# Patient Record
Sex: Male | Born: 1970 | Race: White | Hispanic: No | Marital: Married | Smoking: Never smoker
Health system: Southern US, Community
[De-identification: ages and names within clinical notes are randomized; demographics above are authoritative.]

## PROBLEM LIST (undated history)

## (undated) DIAGNOSIS — I10 Essential (primary) hypertension: Secondary | ICD-10-CM

## (undated) DIAGNOSIS — M1990 Unspecified osteoarthritis, unspecified site: Secondary | ICD-10-CM

## (undated) HISTORY — PX: NOSE SURGERY: SHX723

## (undated) HISTORY — PX: KNEE SURGERY: SHX244

## (undated) HISTORY — DX: Essential (primary) hypertension: I10

---

## 2011-04-05 ENCOUNTER — Ambulatory Visit: Payer: Self-pay

## 2013-02-04 ENCOUNTER — Ambulatory Visit: Payer: Self-pay | Admitting: Otolaryngology

## 2014-02-24 DIAGNOSIS — E669 Obesity, unspecified: Secondary | ICD-10-CM | POA: Insufficient documentation

## 2019-04-19 ENCOUNTER — Ambulatory Visit
Admission: EM | Admit: 2019-04-19 | Discharge: 2019-04-19 | Disposition: A | Discharge status: Home / Self Care | Payer: BLUE CROSS/BLUE SHIELD | Attending: Internal Medicine | Admitting: Internal Medicine

## 2019-04-19 ENCOUNTER — Other Ambulatory Visit: Payer: Self-pay

## 2019-04-19 DIAGNOSIS — Z20822 Contact with and (suspected) exposure to covid-19: Secondary | ICD-10-CM

## 2019-04-19 DIAGNOSIS — R5383 Other fatigue: Secondary | ICD-10-CM | POA: Diagnosis not present

## 2019-04-19 DIAGNOSIS — B349 Viral infection, unspecified: Secondary | ICD-10-CM | POA: Diagnosis present

## 2019-04-19 DIAGNOSIS — U071 COVID-19: Secondary | ICD-10-CM | POA: Insufficient documentation

## 2019-04-19 DIAGNOSIS — R509 Fever, unspecified: Secondary | ICD-10-CM

## 2019-04-19 DIAGNOSIS — R110 Nausea: Secondary | ICD-10-CM

## 2019-04-19 DIAGNOSIS — J029 Acute pharyngitis, unspecified: Secondary | ICD-10-CM

## 2019-04-19 DIAGNOSIS — R05 Cough: Secondary | ICD-10-CM

## 2019-04-19 DIAGNOSIS — M791 Myalgia, unspecified site: Secondary | ICD-10-CM

## 2019-04-19 DIAGNOSIS — R519 Headache, unspecified: Secondary | ICD-10-CM

## 2019-04-19 MED ORDER — OSELTAMIVIR PHOSPHATE 75 MG PO CAPS: 75.0000 mg | ORAL_CAPSULE | Freq: Two times a day (BID) | ORAL | 0 refills | Status: DC

## 2019-04-19 MED ORDER — IBUPROFEN 800 MG PO TABS: 800.0000 mg | ORAL_TABLET | Freq: Three times a day (TID) | ORAL | 0 refills | Status: DC | PRN

## 2019-04-19 NOTE — ED Triage Notes (Signed)
Pt with headache, sore throat, cough, body is sore. Temp of 100.5 last night.

## 2019-04-19 NOTE — ED Provider Notes (Signed)
[facility]    CSN: [id number] Arrival date & time: 04/19/19  0804      History   Chief Complaint Chief Complaint  Patient presents with  . Headache  . Cough    HPI Jared Andrade is a 49 y.o. male.   Subjective:   Jared Andrade is a 49 y.o. male who presents for evaluation of influenza/COVID-like symptoms. Symptoms include fevers up to 101 degrees, chills, headache, myalgias, sore throat, nausea, dizziness and change in taste. Symptoms have been present for 3 days. He has tried to alleviate the symptoms with acetaminophen and rest with minimal relief. He has had known exposure to COVID-19 outside the home. No high risk factors for influenza/COVID complications.   The following portions of the patient's history were reviewed and updated as appropriate: allergies, current medications, past family history, past medical history, past social history, past surgical history and problem list.        History reviewed. No pertinent past medical history.  There are no problems to display for this patient.   History reviewed. No pertinent surgical history.     Home Medications    Prior to Admission medications   Medication Sig Start Date End Date Taking? Authorizing Provider  ibuprofen (ADVIL) 800 MG tablet Take 1 tablet (800 mg total) by mouth every 8 (eight) hours as needed for fever or mild pain. 04/19/19   Lurline Idol, FNP  oseltamivir (TAMIFLU) 75 MG capsule Take 1 capsule (75 mg total) by mouth every 12 (twelve) hours. 04/19/19   Lurline Idol, FNP    Family History History reviewed. No pertinent family history.  Social History Social History   Tobacco Use  . Smoking status: Never Smoker  . Smokeless tobacco: Never Used  Substance Use Topics  . Alcohol use: Not on file  . Drug use: Not on file     Allergies   Patient has no known allergies.   Review of Systems Review of Systems  Constitutional: Positive for chills, fatigue and  fever.  HENT: Positive for sore throat.   Respiratory: Positive for cough.   Gastrointestinal: Positive for nausea. Negative for diarrhea.  Musculoskeletal: Positive for myalgias.  Skin: Negative for rash.  Neurological: Positive for dizziness and headaches.  All other systems reviewed and are negative.    Physical Exam Triage Vital Signs ED Triage Vitals [04/19/19 0818]  Enc Vitals Group     BP (!) 129/97     Pulse Rate (!) 110     Resp 18     Temp 98.8 F (37.1 C)     Temp Source Oral     SpO2 98 %     Weight 234 lb (106.1 kg)     Height 5\' 11"  (1.803 m)     Head Circumference      Peak Flow      Pain Score 8     Pain Loc      Pain Edu?      Excl. in GC?    No data found.  Updated Vital Signs BP (!) 129/97 (BP Location: Left Arm)   Pulse (!) 110   Temp 98.8 F (37.1 C) (Oral)   Resp 18   Ht 5\' 11"  (1.803 m)   Wt 234 lb (106.1 kg)   SpO2 98%   BMI 32.64 kg/m   Visual Acuity Right Eye Distance:   Left Eye Distance:   Bilateral Distance:    Right Eye Near:   Left Eye  Near:    Bilateral Near:     Physical Exam Vitals reviewed.  Constitutional:      General: He is not in acute distress.    Appearance: He is well-developed. He is not ill-appearing or toxic-appearing.  HENT:     Head: Normocephalic.  Cardiovascular:     Rate and Rhythm: Normal rate and regular rhythm.  Pulmonary:     Effort: Pulmonary effort is normal.     Breath sounds: Normal breath sounds.  Musculoskeletal:        General: Normal range of motion.     Cervical back: Normal range of motion and neck supple.  Lymphadenopathy:     Cervical: No cervical adenopathy.  Skin:    General: Skin is warm and dry.     Coloration: Skin is not pale.  Neurological:     Mental Status: He is alert.  Psychiatric:        Mood and Affect: Mood normal.        Speech: Speech normal.        Behavior: Behavior normal.      UC Treatments / Results  Labs (all labs ordered are listed, but only  abnormal results are displayed) Labs Reviewed  NOVEL CORONAVIRUS, NAA (HOSP ORDER, SEND-OUT TO REF LAB; TAT 18-24 HRS)    EKG   Radiology No results found.  Procedures Procedures (including critical care time)  Medications Ordered in UC Medications - No data to display  Initial Impression / Assessment and Plan / UC Course  I have reviewed the triage vital signs and the nursing notes.  Pertinent labs & imaging results that were available during my care of the patient were reviewed by me and considered in my medical decision making (see chart for details).    49 year old male with no significant past medical history presents with a 3-day history of fevers, chills, headache, body aches, sore throat, nausea, dizziness and change of taste.  He has had known exposure to COVID-19.  Patient is afebrile and nontoxic-appearing.  Home isolation and supportive care measures advised.  COVID-19 testing pending.   Today's evaluation has revealed no signs of a dangerous process. Discussed diagnosis with patient and/or guardian. Patient and/or guardian aware of their diagnosis, possible red flag symptoms to watch out for and need for close follow up. Patient and/or guardian understands verbal and written discharge instructions. Patient and/or guardian comfortable with plan and disposition.  Patient and/or guardian has a clear mental status at this time, good insight into illness (after discussion and teaching) and has clear judgment to make decisions regarding their care  This care was provided during an unprecedented National Emergency due to the Novel Coronavirus (COVID-19) pandemic. COVID-19 infections and transmission risks place heavy strains on healthcare resources.  As this pandemic evolves, our facility, providers, and staff strive to respond fluidly, to remain operational, and to provide care relative to available resources and information. Outcomes are unpredictable and treatments are without  well-defined guidelines. Further, the impact of COVID-19 on all aspects of urgent care, including the impact to patients seeking care for reasons other than COVID-19, is unavoidable during this national emergency. At this time of the global pandemic, management of patients has significantly changed, even for non-COVID positive patients given high local and regional COVID volumes at this time requiring high healthcare system and resource utilization. The standard of care for management of both COVID suspected and non-COVID suspected patients continues to change rapidly at the local, regional, national, and global levels.  This patient was worked up and treated to the best available but ever changing evidence and resources available at this current time.   Documentation was completed with the aid of voice recognition software. Transcription may contain typographical errors.  Final Clinical Impressions(s) / UC Diagnoses   Final diagnoses:  Encounter for screening laboratory testing for COVID-19 virus  Contact with and (suspected) exposure to covid-19  Viral illness     Discharge Instructions     Take medications as prescribed. You may take tylenol or ibuprofen as needed for fevers/headache/body aches. Drink plenty of fluids. Stay in home isolation until you receive results of your COVID test. You will only be notified for positive results. You may go online to MyChart in the next few days and review your results. Please follow CDC guidelines that are attached. You may discontinue home isolation when there has been at least 10 days since symptoms onset AND 3 days fever free without antipyretics (Tylenol or Ibuprofen) AND an overall improvement in your symptoms. Go to the ED immediately if you get worse or have any other symptoms.   Feel better soon!  Lelon Mast, FNP-C      ED Prescriptions    Medication Sig Dispense Auth. Provider   oseltamivir (TAMIFLU) 75 MG capsule Take 1 capsule (75 mg total)  by mouth every 12 (twelve) hours. 10 capsule Lurline Idol, FNP   ibuprofen (ADVIL) 800 MG tablet Take 1 tablet (800 mg total) by mouth every 8 (eight) hours as needed for fever or mild pain. 21 tablet Lurline Idol, FNP     PDMP not reviewed this encounter.   Cathlean Sauer [place], [place] 04/19/19 [phone]

## 2019-04-19 NOTE — Discharge Instructions (Signed)
Take medications as prescribed. You may take tylenol or ibuprofen as needed for fevers/headache/body aches. Drink plenty of fluids. Stay in home isolation until you receive results of your COVID test. You will only be notified for positive results. You may go online to MyChart in the next few days and review your results. Please follow CDC guidelines that are attached. You may discontinue home isolation when there has been at least 10 days since symptoms onset AND 3 days fever free without antipyretics (Tylenol or Ibuprofen) AND an overall improvement in your symptoms. Go to the ED immediately if you get worse or have any other symptoms.   Feel better soon!  Victorhugo Preis, FNP-C   

## 2019-04-21 ENCOUNTER — Encounter (HOSPITAL_COMMUNITY): Payer: Self-pay

## 2019-04-21 ENCOUNTER — Telehealth (HOSPITAL_COMMUNITY): Payer: Self-pay | Admitting: Emergency Medicine

## 2019-04-21 LAB — NOVEL CORONAVIRUS, NAA (HOSP ORDER, SEND-OUT TO REF LAB; TAT 18-24 HRS): SARS-CoV-2, NAA: DETECTED — AB

## 2019-04-21 NOTE — Telephone Encounter (Signed)

## 2019-09-10 ENCOUNTER — Other Ambulatory Visit: Payer: Self-pay

## 2019-09-10 ENCOUNTER — Encounter: Payer: Self-pay | Admitting: Emergency Medicine

## 2019-09-10 ENCOUNTER — Ambulatory Visit
Admission: EM | Admit: 2019-09-10 | Discharge: 2019-09-10 | Disposition: A | Discharge status: Home / Self Care | Payer: BLUE CROSS/BLUE SHIELD | Attending: Family Medicine | Admitting: Family Medicine

## 2019-09-10 DIAGNOSIS — M5441 Lumbago with sciatica, right side: Secondary | ICD-10-CM | POA: Diagnosis not present

## 2019-09-10 MED ORDER — TIZANIDINE HCL 4 MG PO TABS: 4.0000 mg | ORAL_TABLET | Freq: Three times a day (TID) | ORAL | 0 refills | Status: DC | PRN

## 2019-09-10 MED ORDER — MELOXICAM 15 MG PO TABS: 15.0000 mg | ORAL_TABLET | Freq: Every day | ORAL | 0 refills | Status: DC | PRN

## 2019-09-10 NOTE — ED Provider Notes (Signed)
[facility]    CSN: [id number] Arrival date & time: 09/10/19  0818      History   Chief Complaint Chief Complaint  Patient presents with  . Back Pain   HPI  49 year old male presents with the above complaint.  Patient reports that several weeks ago he had a bout of low back pain.  He states that he had some numbness/tingling of his right lower extremity.  Subsequently improved but bothers him intermittently.  Patient reports that he was helping a friend put together a gazebo yesterday.  This triggered his back pain again.  He reports that he is sore.  He reports right low back pain with some radicular symptoms down the right leg.  No relieving factors.  Patient does not feel like he can go to work.  No other associated symptoms.  No other complaints  Past Surgical History:  Procedure Laterality Date  . KNEE SURGERY Left    Home Medications    Prior to Admission medications   Medication Sig Start Date End Date Taking? Authorizing Provider  meloxicam (MOBIC) 15 MG tablet Take 1 tablet (15 mg total) by mouth daily as needed (Back pain). 09/10/19   Coral Spikes, DO  tiZANidine (ZANAFLEX) 4 MG tablet Take 1 tablet (4 mg total) by mouth every 8 (eight) hours as needed for muscle spasms. 09/10/19   Coral Spikes, DO    Family History Family History  Problem Relation Age of Onset  . Healthy Mother     Social History Social History   Tobacco Use  . Smoking status: Never Smoker  . Smokeless tobacco: Never Used  Substance Use Topics  . Alcohol use: Yes  . Drug use: Never     Allergies   Patient has no known allergies.   Review of Systems Review of Systems  Constitutional: Negative.   Musculoskeletal: Positive for back pain.   Physical Exam Triage Vital Signs ED Triage Vitals  Enc Vitals Group     BP 09/10/19 0831 (!) 140/105     Pulse Rate 09/10/19 0831 82     Resp 09/10/19 0831 16     Temp 09/10/19 0831 98.2 F (36.8 C)     Temp Source 09/10/19  0831 Oral     SpO2 09/10/19 0831 100 %     Weight 09/10/19 0829 204 lb (92.5 kg)     Height 09/10/19 0829 5\' 11"  (1.803 m)     Head Circumference --      Peak Flow --      Pain Score 09/10/19 0829 7     Pain Loc --      Pain Edu? --      Excl. in [place]? --    Updated Vital Signs BP (!) 140/105 (BP Location: Left Arm)   Pulse 82   Temp 98.2 F (36.8 C) (Oral)   Resp 16   Ht 5\' 11"  (1.803 m)   Wt 92.5 kg   SpO2 100%   BMI 28.45 kg/m   Visual Acuity Right Eye Distance:   Left Eye Distance:   Bilateral Distance:    Right Eye Near:   Left Eye Near:    Bilateral Near:     Physical Exam Vitals and nursing note reviewed.  Constitutional:      General: He is not in acute distress.    Appearance: Normal appearance. He is not ill-appearing.  HENT:     Head: Normocephalic and atraumatic.  Eyes:     General:  Right eye: No discharge.        Left eye: No discharge.     Conjunctiva/sclera: Conjunctivae normal.  Cardiovascular:     Rate and Rhythm: Normal rate and regular rhythm.     Heart sounds: No murmur.  Pulmonary:     Effort: Pulmonary effort is normal. No respiratory distress.  Musculoskeletal:     Comments: Right low back with mild tenderness to palpation.  Neurological:     Mental Status: He is alert.  Psychiatric:        Mood and Affect: Mood normal.        Behavior: Behavior normal.    UC Treatments / Results  Labs (all labs ordered are listed, but only abnormal results are displayed) Labs Reviewed - No data to display  EKG   Radiology No results found.  Procedures Procedures (including critical care time)  Medications Ordered in UC Medications - No data to display  Initial Impression / Assessment and Plan / UC Course  I have reviewed the triage vital signs and the nursing notes.  Pertinent labs & imaging results that were available during my care of the patient were reviewed by me and considered in my medical decision making (see chart for  details).    49 year old male presents with acute low back pain.  Treating with meloxicam and Zanaflex.  Rest, heat.  Work note given.  Final Clinical Impressions(s) / UC Diagnoses   Final diagnoses:  Acute right-sided low back pain with right-sided sciatica     Discharge Instructions     Rest.  Heat.  Medication as prescribed.  Take care  Dr. Adriana Simas    ED Prescriptions    Medication Sig Dispense Auth. Provider   meloxicam (MOBIC) 15 MG tablet Take 1 tablet (15 mg total) by mouth daily as needed (Back pain). 30 tablet Cook, Jayce G, DO   tiZANidine (ZANAFLEX) 4 MG tablet Take 1 tablet (4 mg total) by mouth every 8 (eight) hours as needed for muscle spasms. 30 tablet Tommie Sams, DO     PDMP not reviewed this encounter.   Everlene Other [place], [place] 09/10/19 [phone]

## 2019-09-10 NOTE — Discharge Instructions (Signed)
Rest.  Heat.  Medication as prescribed.  Take care  Dr. Roselene Gray  

## 2019-09-10 NOTE — ED Triage Notes (Signed)
Patient states that he was helping a friend put together a gazebo yesterday.  Patient states that he has been having upper and lower back pain since yesterday.

## 2019-11-07 ENCOUNTER — Other Ambulatory Visit: Payer: Self-pay

## 2019-11-07 ENCOUNTER — Ambulatory Visit
Admission: EM | Admit: 2019-11-07 | Discharge: 2019-11-07 | Disposition: A | Discharge status: Home / Self Care | Payer: BC Managed Care – PPO | Attending: Internal Medicine | Admitting: Internal Medicine

## 2019-11-07 ENCOUNTER — Encounter: Payer: Self-pay | Admitting: Emergency Medicine

## 2019-11-07 ENCOUNTER — Ambulatory Visit (INDEPENDENT_AMBULATORY_CARE_PROVIDER_SITE_OTHER): Payer: BC Managed Care – PPO

## 2019-11-07 DIAGNOSIS — G5711 Meralgia paresthetica, right lower limb: Secondary | ICD-10-CM | POA: Diagnosis not present

## 2019-11-07 DIAGNOSIS — M545 Low back pain: Secondary | ICD-10-CM

## 2019-11-07 DIAGNOSIS — G8929 Other chronic pain: Secondary | ICD-10-CM

## 2019-11-07 MED ORDER — CYCLOBENZAPRINE HCL 10 MG PO TABS: 10.0000 mg | ORAL_TABLET | Freq: Three times a day (TID) | ORAL | 0 refills | Status: DC

## 2019-11-07 MED ORDER — PREDNISONE 20 MG PO TABS: 20.0000 mg | ORAL_TABLET | Freq: Every day | ORAL | 0 refills | Status: DC

## 2019-11-07 NOTE — ED Provider Notes (Signed)
[facility]    CSN: [id number] Arrival date & time: 11/07/19  4709      History   Chief Complaint Chief Complaint  Patient presents with  . Back Pain    HPI Jared Andrade is a 49 y.o. male. 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  The pain was provoked with turning his thorax. He was diagnosed with sciatica in May this year and was given muscle relaxers which helped. He has intermittent numbness on his R anterior thigh, some days worse than others. He has never had any imaging done since he has been dealing with back pain.      History reviewed. No pertinent past medical history.  There are no problems to display for this patient.   Past Surgical History:  Procedure Laterality Date  . KNEE SURGERY Left        Home Medications    Prior to Admission medications   Medication Sig Start Date End Date Taking? Authorizing Provider  cyclobenzaprine (FLEXERIL) 10 MG tablet Take 1 tablet (10 mg total) by mouth 3 (three) times daily. Prn muscle spasms 11/07/19   Rodriguez-Southworth, Nettie Elm, PA-C  predniSONE (DELTASONE) 20 MG tablet Take 1 tablet (20 mg total) by mouth daily with breakfast. For inflammation 11/07/19   Rodriguez-Southworth, Nettie Elm, PA-C    Family History Family History  Problem Relation Age of Onset  . Healthy Mother     Social History Social History   Tobacco Use  . Smoking status: Never Smoker  . Smokeless tobacco: Never Used  Vaping Use  . Vaping Use: Never used  Substance Use Topics  . Alcohol use: Yes  . Drug use: Never     Allergies   Patient has no known allergies.   Review of Systems Review of Systems  Constitutional: Negative for fever.  Gastrointestinal: Negative for abdominal pain.  Musculoskeletal: Positive for back pain. Negative for arthralgias, joint swelling and  myalgias.  Skin: Negative for rash and wound.  Neurological: Positive for numbness. Negative for weakness.     Physical Exam Triage Vital Signs ED Triage Vitals  Enc Vitals Group     BP 11/07/19 0828 (!) 145/107     Pulse Rate 11/07/19 0828 85     Resp 11/07/19 0828 16     Temp 11/07/19 0828 98.4 F (36.9 C)     Temp Source 11/07/19 0828 Oral     SpO2 11/07/19 0828 100 %     Weight 11/07/19 0826 (!) 205 lb (93 kg)     Height 11/07/19 0826 6' (1.829 m)     Head Circumference --      Peak Flow --      Pain Score 11/07/19 0826 6     Pain Loc --      Pain Edu? --      Excl. in [place]? --    No data found.  Updated Vital Signs BP (!) 145/107 (BP Location: Left Arm)   Pulse 85   Temp 98.4 F (36.9 C) (Oral)   Resp 16   Ht 6' (1.829 m)   Wt (!) 205 lb (93 kg)   SpO2 100%   BMI 27.80 kg/m   Visual Acuity Right Eye Distance:   Left Eye Distance:   Bilateral Distance:    Right Eye Near:  Left Eye Near:    Bilateral Near:     Physical Exam Vitals and nursing note reviewed.  Constitutional:      General: He is not in acute distress.    Appearance: He is normal weight. He is not toxic-appearing.  HENT:     Head: Atraumatic.     Right Ear: External ear normal.     Left Ear: External ear normal.  Eyes:     General: No scleral icterus.    Extraocular Movements: Extraocular movements intact.     Conjunctiva/sclera: Conjunctivae normal.  Pulmonary:     Effort: Pulmonary effort is normal.  Musculoskeletal:        General: Normal range of motion.     Cervical back: Neck supple.     Right lower leg: No edema.     Left lower leg: No edema.     Comments: BACK- has local tenderness on R lower lumbar region, and also provoked with R lateral flexion, anterior flexion > 80 degrees and with posterior flexion. Has neg SLR  Skin:    General: Skin is warm and dry.     Findings: No rash.  Neurological:     Mental Status: He is alert and oriented to person, place, and time.      Motor: No weakness.     Gait: Gait normal.     Deep Tendon Reflexes: Reflexes normal.     Comments: R THIGH- Looks normal, no redness, hotness or deformity noted. Has an area of 3 cm x 4 cm that he has no sensation.   Psychiatric:        Mood and Affect: Mood normal.        Behavior: Behavior normal.        Thought Content: Thought content normal.        Judgment: Judgment normal.      UC Treatments / Results  Labs (all labs ordered are listed, but only abnormal results are displayed) Labs Reviewed - No data to display  EKG   Radiology DG Lumbar Spine Complete  Result Date: 11/07/2019 CLINICAL DATA:  Right lumbar back pain for 1 year, numbness in the right leg and knee EXAM: LUMBAR SPINE - COMPLETE 4+ VIEW COMPARISON:  None. FINDINGS: No fracture or dislocation of the lumbar spine. There appears to be transitional anatomy with partial left-sided sacralization of the fifth non rib-bearing vertebral body. Minimal endplate osteophytosis without significant disc space height loss. Mild multilevel facet degenerative change of the lower lumbar spine. Nonobstructive pattern of overlying bowel gas. IMPRESSION: 1.  No fracture or dislocation of the lumbar spine. 2. There appears to be transitional anatomy with partial left-sided sacralization of the fifth non rib-bearing vertebral body. 3. Minimal endplate osteophytosis without significant disc space height loss. Facet degenerative change of the lower lumbar spine. 4. MRI may be used to further assess lumbar disc and neural foraminal pathology as well as transitional anatomy if desired. Electronically Signed   By: Lauralyn PrimesAlex  Bibbey M.D.   On: 11/07/2019 09:40    Procedures Procedures (including critical care time)  Medications Ordered in UC Medications - No data to display  Initial Impression / Assessment and Plan / UC Course  I have reviewed the triage vital signs and the nursing notes. Pertinent  imaging results that were available during my  care of the patient were reviewed by me and considered in my medical decision making (see chart for details). His lumbar spine xray reveal facet arthropathy and it is recommended  by the radiologist for pt to have MRI. I gave him the copy of this report to take with him to show his PCP or neurologist. In the mean time I placed him on Flexeril and Prednisone as noted.  Final Clinical Impressions(s) / UC Diagnoses   Final diagnoses:  Meralgia paresthetica of right side  Chronic right-sided low back pain without sciatica     Discharge Instructions     I suspect you have this condition and you need to follow up with a neurologist to have more test done.  What is meralgia paresthetica? -- Meralgia paresthetica is a condition that causes pain, tingling, and numbness in the outer thigh. It happens when a nerve in that area gets squeezed or compressed. Different things can cause meralgia paresthetica. They include pregnancy, wearing tight belts or waistbands, leaning the thigh on something for a long time, and injury to the area. Sometimes, it can happen after surgery in the area. Meralgia paresthetica is more common in people who have diabetes or obesity, and in older people. It is not a serious condition, and it usually goes away on its own. What are the symptoms of meralgia paresthetica? -- The main symptoms involve the upper, outer thigh. They can include: ?Pain - Pain can be burning or stinging. ?Tingling - This can feel like "pins and needles" in the area. ?Numbness ?Feeling extra sensitive to touch - Even light touch, like the feelings of clothing on the skin, might be unpleasant. ?Itching Symptoms usually affect only 1 of the thighs. Will I need tests? -- Your doctor should be able to tell if you have meralgia paresthetica by learning about your symptoms and doing a "neurological exam." In a neurological exam, the doctor checks how the brain, nerves, and muscles are working. Sometimes doctors  do other tests to make sure something else is not causing your symptoms. This is more likely if you have any symptoms that are different from the ones listed above. Other tests might include: ?MRI of the spine - An MRI is a type of imaging test. It creates pictures of the inside of your body. ?Nerve conduction studies ("NCS") or electromyography ("EMG") - These tests check how well your nerves and muscles are working. How is meralgia paresthetica treated? -- Meralgia paresthetica usually goes away on its own within a few weeks or months. If your symptoms bother you, it might help to: ?Avoid wearing tight belts or clothing with a tight waistband. These things can put pressure on the nerve that runs from your lower spine to your thigh. ?Consider losing weight if you are overweight. Your doctor or nurse can talk to you about healthy ways to lose weight if this is something you want to do. ?Take pain-relieving medicines such as acetaminophen (brand name: Tylenol) or ibuprofen (sample brand names: Advil, Motrin). If your symptoms last for longer than 1 or 2 months, tell your doctor or nurse. They might suggest trying other treatments, such as pain medicines or a shot of medicine to numb the area. Sometimes surgery is recommended for people with severe symptoms, but this is rare.    ED Prescriptions    Medication Sig Dispense Auth. Provider   cyclobenzaprine (FLEXERIL) 10 MG tablet Take 1 tablet (10 mg total) by mouth 3 (three) times daily. Prn muscle spasms 30 tablet Rodriguez-Southworth, Nettie Elm, PA-C   predniSONE (DELTASONE) 20 MG tablet Take 1 tablet (20 mg total) by mouth daily with breakfast. For inflammation 5 tablet Rodriguez-Southworth, Nettie Elm, PA-C  PDMP not reviewed this encounter.   Garey Ham, [place] 11/07/19 [phone]

## 2019-11-07 NOTE — ED Triage Notes (Signed)
Patient c/o lower back pain that he states has been going on for awhile.  Patient states that he has sharp pain that goes down his right leg for the past couple of weeks. Patient denies recent injury or fall.

## 2019-11-07 NOTE — Discharge Instructions (Addendum)
I suspect you have this condition and you need to follow up with a neurologist to have more test done.  What is meralgia paresthetica? -- Meralgia paresthetica is a condition that causes pain, tingling, and numbness in the outer thigh. It happens when a nerve in that area gets squeezed or compressed. Different things can cause meralgia paresthetica. They include pregnancy, wearing tight belts or waistbands, leaning the thigh on something for a long time, and injury to the area. Sometimes, it can happen after surgery in the area. Meralgia paresthetica is more common in people who have diabetes or obesity, and in older people. It is not a serious condition, and it usually goes away on its own. What are the symptoms of meralgia paresthetica? -- The main symptoms involve the upper, outer thigh. They can include: ?Pain - Pain can be burning or stinging. ?Tingling - This can feel like "pins and needles" in the area. ?Numbness ?Feeling extra sensitive to touch - Even light touch, like the feelings of clothing on the skin, might be unpleasant. ?Itching Symptoms usually affect only 1 of the thighs. Will I need tests? -- Your doctor should be able to tell if you have meralgia paresthetica by learning about your symptoms and doing a "neurological exam." In a neurological exam, the doctor checks how the brain, nerves, and muscles are working. Sometimes doctors do other tests to make sure something else is not causing your symptoms. This is more likely if you have any symptoms that are different from the ones listed above. Other tests might include: ?MRI of the spine - An MRI is a type of imaging test. It creates pictures of the inside of your body. ?Nerve conduction studies ("NCS") or electromyography ("EMG") - These tests check how well your nerves and muscles are working. How is meralgia paresthetica treated? -- Meralgia paresthetica usually goes away on its own within a few weeks or months. If your symptoms  bother you, it might help to: ?Avoid wearing tight belts or clothing with a tight waistband. These things can put pressure on the nerve that runs from your lower spine to your thigh. ?Consider losing weight if you are overweight. Your doctor or nurse can talk to you about healthy ways to lose weight if this is something you want to do. ?Take pain-relieving medicines such as acetaminophen (brand name: Tylenol) or ibuprofen (sample brand names: Advil, Motrin). If your symptoms last for longer than 1 or 2 months, tell your doctor or nurse. They might suggest trying other treatments, such as pain medicines or a shot of medicine to numb the area. Sometimes surgery is recommended for people with severe symptoms, but this is rare.

## 2021-04-03 ENCOUNTER — Ambulatory Visit
Admission: EM | Admit: 2021-04-03 | Discharge: 2021-04-03 | Disposition: A | Discharge status: Home / Self Care | Payer: BC Managed Care – PPO | Attending: Internal Medicine | Admitting: Internal Medicine

## 2021-04-03 ENCOUNTER — Other Ambulatory Visit: Payer: Self-pay

## 2021-04-03 ENCOUNTER — Telehealth: Payer: Self-pay | Admitting: Internal Medicine

## 2021-04-03 DIAGNOSIS — U071 COVID-19: Secondary | ICD-10-CM

## 2021-04-03 LAB — RESP PANEL BY RT-PCR (FLU A&B, COVID) ARPGX2
Influenza A by PCR: NEGATIVE
Influenza B by PCR: NEGATIVE
SARS Coronavirus 2 by RT PCR: POSITIVE — AB

## 2021-04-03 MED ORDER — ACETAMINOPHEN 325 MG PO TABS
650.0000 mg | ORAL_TABLET | Freq: Once | ORAL | Status: AC
  Administered 2021-04-03: 09:00:00 650 mg via ORAL

## 2021-04-03 MED ORDER — PAXLOVID (300/100) 20 X 150 MG & 10 X 100MG PO TBPK: 1.0000 | ORAL_TABLET | Freq: Two times a day (BID) | ORAL | 0 refills | Status: AC

## 2021-04-03 NOTE — ED Provider Notes (Signed)
[facility]    CSN: [id number] Arrival date & time: 04/03/21  0808      History   Chief Complaint Chief Complaint  Patient presents with   Generalized Body Aches   Nasal Congestion    HPI Jared Andrade is a 50 y.o. male.  He presents today with the onset of fever since yesterday, body aches, little bit of cough.  Some nausea and feels kind of dizzy.  Little bit of sore throat, little bit of cough, little bit of runny nose.  Has not vomited, no diarrhea.  His wife has tested positive for COVID; the patient had a negative home COVID test last night.  He also has coworkers who have the flu.  HPI  History reviewed. No pertinent past medical history.  Patient reports "bad knees," takes no meds regularly.  Typically has slightly elevated blood pressure when he reports for medical care.  There are no problems to display for this patient.   Past Surgical History:  Procedure Laterality Date   KNEE SURGERY Left        Home Medications    Prior to Admission medications   Medication Sig Start Date End Date Taking? Authorizing Provider  nirmatrelvir & ritonavir (PAXLOVID, 300/100,) 20 x 150 MG & 10 x 100MG  TBPK Take 1 tablet by mouth 2 (two) times daily for 5 days. 04/03/21 04/08/21 Yes 04/10/21, MD  Takes occasional Aleve for knee pain  Family History Family History  Problem Relation Age of Onset   Healthy Mother     Social History Social History   Tobacco Use   Smoking status: Never   Smokeless tobacco: Never  Vaping Use   Vaping Use: Never used  Substance Use Topics   Alcohol use: Yes   Drug use: Never     Allergies   Patient has no known allergies.   Review of Systems Review of Systems see HPI   Physical Exam Triage Vital Signs ED Triage Vitals  Enc Vitals Group     BP 04/03/21 0832 (!) 149/118     Pulse Rate 04/03/21 0832 (!) 110     Resp 04/03/21 0832 18     Temp 04/03/21 0832 (!) 102.3 F (39.1 C)     Temp Source  04/03/21 0832 Oral     SpO2 04/03/21 0832 99 %     Weight 04/03/21 0829 235 lb (106.6 kg)     Height 04/03/21 0829 6' (1.829 m)     Head Circumference --      Peak Flow --      Pain Score 04/03/21 0829 9     Pain Loc --    Updated Vital Signs BP (!) 149/118 (BP Location: Left Arm)    Pulse (!) 110    Temp (!) 102.3 F (39.1 C) (Oral)    Resp 18    Ht 6' (1.829 m)    Wt 106.6 kg    SpO2 99%    BMI 31.87 kg/m   Physical Exam Constitutional:      General: He is not in acute distress.    Appearance: He is ill-appearing. He is not toxic-appearing.     Comments: Nicely groomed  HENT:     Head: Atraumatic.     Comments: Bilateral TMs slightly pink, no dullness Moderate nasal congestion bilaterally Throat is pink Eyes:     Conjunctiva/sclera:     Right eye: Right conjunctiva is not injected. No exudate.    Left eye:  Left conjunctiva is not injected. No exudate.    Comments: Conjugate gaze observed  Cardiovascular:     Rate and Rhythm: Regular rhythm. Tachycardia present.  Pulmonary:     Effort: Pulmonary effort is normal. No respiratory distress.     Breath sounds: No wheezing or rhonchi.  Abdominal:     General: There is no distension.  Musculoskeletal:     Cervical back: Neck supple.     Comments: Walked into the urgent care independently  Skin:    General: Skin is warm and dry.     Comments: Pink, no cyanosis  Neurological:     Mental Status: He is alert.     Comments: Face symmetric, speech clear/coherent     UC Treatments / Results  Labs (all labs ordered are listed, but only abnormal results are displayed) Labs Reviewed  RESP PANEL BY RT-PCR (FLU A&B, COVID) ARPGX2 - Abnormal; Notable for the following components:      Result Value   SARS Coronavirus 2 by RT PCR POSITIVE (*)    All other components within normal limits    EKG N/A  Radiology No results found. No imaging done at urgent care visit  Procedures Procedures (including critical care  time) N/A  Medications Ordered in UC Medications  acetaminophen (TYLENOL) tablet 650 mg (650 mg Oral Given 04/03/21 0845)      Final Clinical Impressions(s) / UC Diagnoses   Final diagnoses:  COVID-19 virus infection     Discharge Instructions      Symptoms and testing today indicate that you have covid.  Prescription for paxlovid (for covid) sent to the pharmacy.  Push fluids and rest.  Take tylenol or advil otc as needed for fever, discomfort.  Eat fruits and vegetables to help your immune system do its best work.  Anticipate gradual improvement over the next several days.  Recheck for new fever >100.5, increasing phlegm production/nasal discharge, or if not starting to improve in a few days.        ED Prescriptions     Medication Sig Dispense Auth. Provider   nirmatrelvir & ritonavir (PAXLOVID, 300/100,) 20 x 150 MG & 10 x 100MG  TBPK Take 1 tablet by mouth 2 (two) times daily for 5 days. 10 tablet , MD      PDMP not reviewed this encounter.   Isa Rankin, MD 04/04/21 [phone]

## 2021-04-03 NOTE — Telephone Encounter (Addendum)
Patient called to say pharmacy had not received medication rx.  Rx paxlovid was sent to [facility] originally.  Spoke to the pharmacist to clarify prescription questions.

## 2021-04-03 NOTE — ED Triage Notes (Signed)
Pt c/o covid and flu exposure. Pt has light headedness, body aches, congestion, cough starting on 04/02/21. Pt states that his wife tested positive for covid and coworkers at work had the flu. Pt asks for a flu and covid test.

## 2021-04-03 NOTE — Discharge Instructions (Addendum)
Symptoms and testing today indicate that you have covid.  Prescription for paxlovid (for covid) sent to the pharmacy.  Push fluids and rest.  Take tylenol or advil otc as needed for fever, discomfort.  Eat fruits and vegetables to help your immune system do its best work.  Anticipate gradual improvement over the next several days.  Recheck for new fever >100.5, increasing phlegm production/nasal discharge, or if not starting to improve in a few days.

## 2021-06-27 ENCOUNTER — Ambulatory Visit
Admission: EM | Admit: 2021-06-27 | Discharge: 2021-06-27 | Disposition: A | Discharge status: Home / Self Care | Payer: BC Managed Care – PPO | Attending: Emergency Medicine | Admitting: Emergency Medicine

## 2021-06-27 ENCOUNTER — Ambulatory Visit: Payer: Self-pay | Admitting: *Deleted

## 2021-06-27 ENCOUNTER — Other Ambulatory Visit: Payer: Self-pay

## 2021-06-27 DIAGNOSIS — I10 Essential (primary) hypertension: Secondary | ICD-10-CM

## 2021-06-27 MED ORDER — AMLODIPINE BESYLATE 5 MG PO TABS: 5.0000 mg | ORAL_TABLET | Freq: Every day | ORAL | 1 refills | Status: DC

## 2021-06-27 NOTE — Telephone Encounter (Signed)
Summary: BP discussion  ? Patient BP has been ranging from 138 or 148 /109, patient has been feeling tired.Patient does not have a PCP but  scheduled  a NPA for 07/26/2021 seeking clinical advice prior appointment   ?  ?Attempted to call patient to discuss his concerns- left message to call office ?

## 2021-06-27 NOTE — Telephone Encounter (Signed)
? ?  Chief Complaint: Htn at health check at work this morning ?Symptoms: 138/108 &148/109, also felt a bit dizzy, has had HA in the past. ?Frequency: has had Htn before ?Pertinent Negatives: Patient denies SOB, chest pain ?Disposition: [] ED /[x] Urgent Care (no appt availability in office) / [] Appointment(In office/virtual)/ []  [facility]/ [] Home Care/ [] Refused Recommended Disposition /[] [facility]/ []  Follow-up with PCP ?Additional Notes: Pt will go to UC now and follow up with NP appointment in April with Dr. . ? ?Reason for Disposition ? Systolic BP  >= 160 OR Diastolic >= 100 ? ?Answer Assessment - Initial Assessment Questions ?1. BLOOD PRESSURE: "What is the blood pressure?" "Did you take at least two measurements 5 minutes apart?" ?    Yes 138 and 148/ 108 and 109 ?2. ONSET: "When did you take your blood pressure?" ?    7:36 am ?3. HOW: "How did you obtain the blood pressure?" (e.g., visiting nurse, automatic home BP monitor) ?    automatic ?4. HISTORY: "Do you have a history of high blood pressure?" ?    A little high in the past. ?5. MEDICATIONS: "Are you taking any medications for blood pressure?" "Have you missed any doses recently?" ?    no ?6. OTHER SYMPTOMS: "Do you have any symptoms?" (e.g., headache, chest pain, blurred vision, difficulty breathing, weakness) ?    HA - tunnel vision ?7. PREGNANCY: "Is there any chance you are pregnant?" "When was your last menstrual period?" ?    na ? ?Protocols used: Blood Pressure - High-A-AH ? ?

## 2021-06-27 NOTE — ED Triage Notes (Signed)
Patient presents to Urgent Care with complaints of elevated BP, BP ranging 138/109. Pt states he is in the process of establishing care with PCP. Pt states he had a wellness screening today, spoke with nurse who instructed him to come to UC for eval.  ? ?Denies dizziness or headache.  ?

## 2021-06-27 NOTE — Discharge Instructions (Addendum)
Start the amlodipine.  It may cause swelling in your legs, but this will resolve with elevation and compression stockings.  You can decrease it to 2.5 mg every day if you start to get dizzy or lightheaded. ? ?Decrease your salt intake. diet and regular exercise will lower your blood pressure significantly. It is important to keep your blood pressure under good control, as having a elevated blood pressure for prolonged periods of time significantly increases your risk of stroke, heart attacks, kidney damage, eye damage, and other problems. Measure your blood pressure once a day, preferably at the same time every day. Keep a log of this and bring it to your next doctor's appointment.  Bring your blood pressure cuff as well.  Return here in 2 weeks for a blood pressure recheck if you're unable to find a primary care physician by then. Return immediately to the ER if you start having chest pain, headache, problems seeing, problems talking, problems walking, if you feel like you're about to pass out, if you do pass out, if you have a seizure, or for any other concerns. ? ?Go to www.goodrx.com  or www.costplusdrugs.com to look up your medications. This will give you a list of where you can find your prescriptions at the most affordable prices. Or ask the pharmacist what the cash price is, or if they have any other discount programs available to help make your medication more affordable. This can be less expensive than what you would pay with insurance.   ?

## 2021-06-27 NOTE — ED Provider Notes (Signed)
HPI ? ?SUBJECTIVE: ? ?Jared Andrade is a 51 y.o. male who presents with an elevated blood pressure reading 148/109 during a health screening at work today.  He does not measure his blood pressure at home, but has a blood pressure cuff.  He denies excess salt intake, he is not a smoker.  He has no formal exercise program, although he is on his feet all day at work.  No headache, visual changes, slurred speech, facial droop, arm or leg weakness, discoordination, chest pain, shortness of breath, palpitations, syncope, seizures, tearing chest pain, abdominal pain, lower extremity edema, anuria, hematuria.  No recent use of decongestants.  He denies using illicit drugs.  He states that he has been more stressed than usual recently.  No aggravating or alleviating factors.  He has not tried anything for his blood pressure.  He has no past medical history.  He is establishing care with [facility] on 4/13. ? ?History reviewed. No pertinent past medical history. ? ?Past Surgical History:  ?Procedure Laterality Date  ? KNEE SURGERY Left   ? ? ?Family History  ?Problem Relation Age of Onset  ? Healthy Mother   ? ? ?Social History  ? ?Tobacco Use  ? Smoking status: Never  ? Smokeless tobacco: Never  ?Vaping Use  ? Vaping Use: Never used  ?Substance Use Topics  ? Alcohol use: Yes  ? Drug use: Never  ? ? ?No current facility-administered medications for this encounter. ? ?Current Outpatient Medications:  ?  amLODipine (NORVASC) 5 MG tablet, Take 1 tablet (5 mg total) by mouth daily., Disp: 30 tablet, Rfl: 1 ? ?No Known Allergies ? ? ?ROS ? ?As noted in HPI.  ? ?Physical Exam ? ?BP (S) (!) 160/113   Pulse 69   Temp 98.2 ?F (36.8 ?C) (Oral)   Resp 16   SpO2 100%  ?BP Readings from Last 3 Encounters:  ?06/27/21 (S) (!) 160/113  ?04/03/21 (!) 149/118  ?11/07/19 (!) 145/107  ?  ? ? ?Constitutional: Well developed, well nourished, no acute distress ?Eyes:  EOMI, conjunctiva normal bilaterally ?HENT: Normocephalic,  atraumatic,mucus membranes moist ?Respiratory: Normal inspiratory effort, lungs clear bilaterally ?Cardiovascular: Normal rate, regular rhythm, no murmurs rubs or gallop ?GI: nondistended ?skin: No rash, skin intact ?Musculoskeletal: Calves symmetric, nontender, no edema ?Neurologic: Alert & oriented x 3, no focal neuro deficits ?Psychiatric: Speech and behavior appropriate ? ? ?ED Course ? ? ?Medications - No data to display ? ?No orders of the defined types were placed in this encounter. ? ? ?No results found for this or any previous visit (from the past 24 hour(s)). ?No results found. ? ?ED Clinical Impression ? ?1. Essential hypertension   ?  ? ?ED Assessment/Plan ? ?Previous records reviewed.  He has been persistently hypertensive on multiple visits since May 21. ? ?Pt hypertensive today. Pt has no historical evidence of end organ damage as noted in HPI we will start him on amlodipine 5 mg, if this makes him dizzy or lightheaded, he can cut it in half to 2.5 mg daily.  He is to keep a log of his blood pressure and bring it in his cuff with him to his next doctor's visit.  If he is unable to see his primary care provider, he will return here if his blood pressures consistently above 140/90 and we can reevaluate, and adjust his medications if necessary.  Discussed importance of lifestyle modifications as important first steps, and the importance of good blood pressure control.Marland Kitchen  Pt to f/u as OP.  ER return precautions given ? ?Discussed MDM, treatment plan, and plan for follow-up with patient. Discussed sn/sx that should prompt return to the ED. patient agrees with plan.  ? ?Meds ordered this encounter  ?Medications  ? amLODipine (NORVASC) 5 MG tablet  ?  Sig: Take 1 tablet (5 mg total) by mouth daily.  ?  Dispense:  30 tablet  ?  Refill:  1  ? ? ? ? ?*This clinic note was created using Scientist, clinical (histocompatibility and immunogenetics). Therefore, there may be occasional mistakes despite careful proofreading. ? ?? ? ?  ?Domenick Gong, MD ?06/27/21 1650 ? ?

## 2021-07-26 ENCOUNTER — Encounter: Payer: Self-pay | Admitting: Family Medicine

## 2021-07-26 ENCOUNTER — Ambulatory Visit
Admission: RE | Admit: 2021-07-26 | Discharge: 2021-07-26 | Disposition: A | Discharge status: Home / Self Care | Payer: BC Managed Care – PPO | Attending: Family Medicine | Admitting: Family Medicine

## 2021-07-26 ENCOUNTER — Ambulatory Visit
Admission: RE | Admit: 2021-07-26 | Discharge: 2021-07-26 | Disposition: A | Discharge status: Home / Self Care | Payer: BC Managed Care – PPO | Source: Ambulatory Visit | Attending: Family Medicine | Admitting: Family Medicine

## 2021-07-26 ENCOUNTER — Ambulatory Visit (INDEPENDENT_AMBULATORY_CARE_PROVIDER_SITE_OTHER): Payer: BC Managed Care – PPO | Admitting: Family Medicine

## 2021-07-26 VITALS — BP 128/80 | HR 80 | Ht 72.0 in | Wt 234.0 lb

## 2021-07-26 DIAGNOSIS — M25562 Pain in left knee: Secondary | ICD-10-CM | POA: Diagnosis not present

## 2021-07-26 DIAGNOSIS — G8929 Other chronic pain: Secondary | ICD-10-CM | POA: Insufficient documentation

## 2021-07-26 DIAGNOSIS — M5410 Radiculopathy, site unspecified: Secondary | ICD-10-CM | POA: Diagnosis not present

## 2021-07-26 DIAGNOSIS — G43109 Migraine with aura, not intractable, without status migrainosus: Secondary | ICD-10-CM | POA: Insufficient documentation

## 2021-07-26 DIAGNOSIS — I10 Essential (primary) hypertension: Secondary | ICD-10-CM | POA: Diagnosis not present

## 2021-07-26 DIAGNOSIS — M1712 Unilateral primary osteoarthritis, left knee: Secondary | ICD-10-CM | POA: Insufficient documentation

## 2021-07-26 DIAGNOSIS — M5417 Radiculopathy, lumbosacral region: Secondary | ICD-10-CM | POA: Insufficient documentation

## 2021-07-26 MED ORDER — AMLODIPINE BESYLATE 5 MG PO TABS: 5.0000 mg | ORAL_TABLET | Freq: Every day | ORAL | 1 refills | Status: DC

## 2021-07-26 NOTE — Progress Notes (Signed)
?  ? ?  Primary Care / Sports Medicine Office Visit ? ?Patient Information:  ?Patient ID: Jared Andrade, male DOB: 10-27-1970 Age: 51 y.o. MRN: [id number]  ? ?Jared Andrade is a pleasant 51 y.o. male presenting with the following: ? ?Chief Complaint  ?Patient presents with  ? Establish Care  ?  Work told him his sugar was high, was not fasting. Pt states he feels fine. Does get cramp in right ribs at time. Pt states that some times he will start having pain in eyes and feels like he is in tunnel for about 10-15 mins and then will fill out of it for a little while later, was told prior it was from stress, pt states he isn't stressed.   ? Hypertension  ?  Pt is on Amlodipine 5mg  and is feeling fine.   ? ? ?Vitals:  ? 07/26/21 1450  ?BP: 128/80  ?Pulse: 80  ?SpO2: 98%  ? ?Vitals:  ? 07/26/21 1450  ?Weight: 234 lb (106.1 kg)  ?Height: 6' (1.829 m)  ? ?Body mass index is 31.74 kg/m?. ? ?No results found.  ? ?Independent interpretation of notes and tests performed by another provider:  ? ?None ? ?Procedures performed:  ? ?None ? ?Pertinent History, Exam, Impression, and Recommendations:  ? ?Primary hypertension ?Chronic issue for which he was started on amlodipine through the urgent care, since that time record review (urgent care encounters from 11/07/2019, 02/01/2021, and 06/27/2021 were reviewed) ?reveals excellent blood pressure control.  He is asymptomatic from a cardiopulmonary standpoint.  Physical examination reveals positive S1 and S2, regular rate and rhythm, no additional heart sounds, no JVD, no carotid bruits, no peripheral edema, and symmetric pulses are noted, clear lung fields throughout without wheezes, rales, rhonchi. ? ?I advised the need for risk stratification labs, this to be coordinated at his annual physical that will be upcoming, over the interim he is to continue with lifestyle changes, amlodipine, and maintain close follow-up. ? ?Chronic condition, stable, record review ? ?Radicular syndrome  of right leg ?Patient with right upper quadrant abdominal pain, deep in nature, questionable radiation to the right leg, primarily to the thigh, symptoms distal have been noted.  Has occurred on and off for years, record review does reveal a history of meralgia paresthetica concern.  Examination today reveals benign abdominal examination, equivocal FABER, negative straight leg raise, resisted hip flexion asymptomatic. ? ?While his examination is reassuring, concern for iliopsoas versus abdominal etiology, plan for dedicated x-rays of lumbar spine, GI lifestyle changes advised and monitoring, will have him return for follow-up for reevaluation. ? ?Chronic pain of left knee ?Chronic anterior knee pain in the setting of multiple arthroscopies, he is still symptomatic, x-rays ordered for further evaluation and management to be discussed at next visit.  ? ?Orders & Medications ?Meds ordered this encounter  ?Medications  ? amLODipine (NORVASC) 5 MG tablet  ?  Sig: Take 1 tablet (5 mg total) by mouth daily.  ?  Dispense:  30 tablet  ?  Refill:  1  ? ?Orders Placed This Encounter  ?Procedures  ? DG Lumbar Spine Complete  ? DG Knee Complete 4 Views Left  ? Ambulatory referral to Neurology  ?  ? ?Return in about 4 weeks (around 08/23/2021) for Annual Physical.  ?  ? ?10/23/2021, MD ? ? Primary Care Sports Medicine ?[facility] ?[facility]  ? ?

## 2021-07-26 NOTE — Assessment & Plan Note (Addendum)
Chronic issue for which he was started on amlodipine through the urgent care, since that time record review (urgent care encounters from 11/07/2019, 02/01/2021, and 06/27/2021 were reviewed) ?reveals excellent blood pressure control.  He is asymptomatic from a cardiopulmonary standpoint.  Physical examination reveals positive S1 and S2, regular rate and rhythm, no additional heart sounds, no JVD, no carotid bruits, no peripheral edema, and symmetric pulses are noted, clear lung fields throughout without wheezes, rales, rhonchi. ? ?I advised the need for risk stratification labs, this to be coordinated at his annual physical that will be upcoming, over the interim he is to continue with lifestyle changes, amlodipine, and maintain close follow-up. ? ?Chronic condition, stable, record review ?

## 2021-07-26 NOTE — Patient Instructions (Signed)
-   Continue amlodipine ?- Referral coordinator will contact you in regards to following up with neurology ?- Obtain x-rays today ?- Return in 1 month for annual physical ?

## 2021-07-26 NOTE — Assessment & Plan Note (Signed)
Chronic anterior knee pain in the setting of multiple arthroscopies, he is still symptomatic, x-rays ordered for further evaluation and management to be discussed at next visit. ?

## 2021-07-26 NOTE — Assessment & Plan Note (Signed)
Patient with right upper quadrant abdominal pain, deep in nature, questionable radiation to the right leg, primarily to the thigh, symptoms distal have been noted.  Has occurred on and off for years, record review does reveal a history of meralgia paresthetica concern.  Examination today reveals benign abdominal examination, equivocal FABER, negative straight leg raise, resisted hip flexion asymptomatic. ? ?While his examination is reassuring, concern for iliopsoas versus abdominal etiology, plan for dedicated x-rays of lumbar spine, GI lifestyle changes advised and monitoring, will have him return for follow-up for reevaluation. ?

## 2021-08-17 ENCOUNTER — Other Ambulatory Visit: Payer: Self-pay | Admitting: Family Medicine

## 2021-08-17 DIAGNOSIS — I10 Essential (primary) hypertension: Secondary | ICD-10-CM

## 2021-08-17 NOTE — Telephone Encounter (Signed)
last RF 07/26/21 #30 1 RF   Too soon ?Pt has appt next Friday 5/12 ?Requested Prescriptions  ?Refused Prescriptions Disp Refills  ?? amLODipine (NORVASC) 5 MG tablet [Pharmacy Med Name: AMLODIPINE BESYLATE 5 MG TAB] 30 tablet 1  ?  Sig: TAKE 1 TABLET (5 MG TOTAL) BY MOUTH DAILY.  ?  ? Cardiovascular: Calcium Channel Blockers 2 Passed - 08/17/2021 10:31 AM  ?  ?  Passed - Last BP in normal range  ?  BP Readings from Last 1 Encounters:  ?07/26/21 128/80  ?   ?  ?  Passed - Last Heart Rate in normal range  ?  Pulse Readings from Last 1 Encounters:  ?07/26/21 80  ?   ?  ?  Passed - Valid encounter within last 6 months  ?  Recent Outpatient Visits   ?      ? 3 weeks ago Primary hypertension  ? [facility] [name], MD  ?  ?  ?Future Appointments   ?        ? In 1 week Ashley Royalty Ocie Bob, MD [facility]  ?  ? ?  ?  ?  ? ? ?

## 2021-08-24 ENCOUNTER — Ambulatory Visit (INDEPENDENT_AMBULATORY_CARE_PROVIDER_SITE_OTHER): Payer: BC Managed Care – PPO | Admitting: Family Medicine

## 2021-08-24 ENCOUNTER — Encounter: Payer: Self-pay | Admitting: Family Medicine

## 2021-08-24 VITALS — BP 126/76 | HR 88 | Ht 72.0 in | Wt 232.0 lb

## 2021-08-24 DIAGNOSIS — Z1159 Encounter for screening for other viral diseases: Secondary | ICD-10-CM | POA: Diagnosis not present

## 2021-08-24 DIAGNOSIS — M1712 Unilateral primary osteoarthritis, left knee: Secondary | ICD-10-CM

## 2021-08-24 DIAGNOSIS — I10 Essential (primary) hypertension: Secondary | ICD-10-CM

## 2021-08-24 DIAGNOSIS — Z0000 Encounter for general adult medical examination without abnormal findings: Secondary | ICD-10-CM | POA: Insufficient documentation

## 2021-08-24 DIAGNOSIS — Z114 Encounter for screening for human immunodeficiency virus [HIV]: Secondary | ICD-10-CM

## 2021-08-24 DIAGNOSIS — Z23 Encounter for immunization: Secondary | ICD-10-CM

## 2021-08-24 DIAGNOSIS — M5417 Radiculopathy, lumbosacral region: Secondary | ICD-10-CM

## 2021-08-24 DIAGNOSIS — R7989 Other specified abnormal findings of blood chemistry: Secondary | ICD-10-CM

## 2021-08-24 DIAGNOSIS — Z13220 Encounter for screening for lipoid disorders: Secondary | ICD-10-CM

## 2021-08-24 MED ORDER — AMLODIPINE BESYLATE 5 MG PO TABS: 5.0000 mg | ORAL_TABLET | Freq: Every day | ORAL | 0 refills | Status: DC

## 2021-08-24 NOTE — Patient Instructions (Signed)
-   Obtain fasting labs with orders provided (can have water or black coffee but otherwise no food or drink x 8 hours before labs) - Review information provided - Attend eye doctor annually, dentist every 6 months, work towards or maintain 30 minutes of moderate intensity physical activity at least 5 days per week, and consume a balanced diet - Return in 3 months - Contact us for any questions between now and then 

## 2021-08-24 NOTE — Assessment & Plan Note (Signed)
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  We will attempt to seek authorization for viscosupplementation and have him return for that if able to do so.  Alternatively, cortisone to be considered, we will schedule a follow-up accordingly for reevaluation and to discuss these treatments.  He can continue with OTC topical analgesics over the interim ?

## 2021-08-24 NOTE — Assessment & Plan Note (Signed)
Annual examination completed, risk stratification labs ordered, anticipatory guidance provided.  We will follow labs once resulted. 

## 2021-08-24 NOTE — Progress Notes (Signed)
?  ? ?Annual Physical Exam Visit ? ?Patient Information:  ?Patient ID: ERIK BURKETT, male DOB: Sep 05, 1970 Age: 51 y.o. MRN: [id number]  ? ?Subjective:  ? ?CC: Annual Physical Exam ? ?HPI:  ?Jared Andrade is here for their annual physical. ? ?I reviewed the past medical history, family history, social history, surgical history, and allergies today and changes were made as necessary.  Please see the problem list section below for additional details. ? ?Past Medical History: ?Past Medical History:  ?Diagnosis Date  ? Hypertension   ? ?Past Surgical History: ?Past Surgical History:  ?Procedure Laterality Date  ? KNEE SURGERY Left   ? NOSE SURGERY    ? ?Family History: ?Family History  ?Problem Relation Age of Onset  ? Cancer Mother   ? Cancer Father   ? Drug abuse Sister   ? ?Allergies: ?No Known Allergies ?Health Maintenance: ?Health Maintenance  ?Topic Date Due  ? Hepatitis C Screening  Never done  ? COLONOSCOPY (Pts 45-79yrs Insurance coverage will need to be confirmed)  07/27/2022 (Originally 04/06/2016)  ? TETANUS/TDAP  07/27/2022 (Originally 04/06/1990)  ? Zoster Vaccines- Shingrix (2 of 2) 10/19/2021  ? INFLUENZA VACCINE  11/13/2021  ? HIV Screening  Completed  ? HPV VACCINES  Aged Out  ? COVID-19 Vaccine  Discontinued  ?  ?HM Colonoscopy        ? ? Postponed - COLONOSCOPY (Pts 45-49yrs Insurance coverage will need to be confirmed) (Every 10 Years) Postponed until 07/27/2022  ? ? No completion history exists for this topic.  ? ?  ?  ? ?  ? ?Medications: ?Current Outpatient Medications on File Prior to Visit  ?Medication Sig Dispense Refill  ? naproxen sodium (ALEVE) 220 MG tablet Take 220 mg by mouth.    ? ?No current facility-administered medications on file prior to visit.  ? ? ?Review of Systems: No headache, visual changes, nausea, vomiting, diarrhea, constipation, dizziness, abdominal pain, skin rash, fevers, chills, night sweats, swollen lymph nodes, weight loss, chest pain, body aches, joint  swelling, muscle aches, shortness of breath, mood changes, visual or auditory hallucinations reported. ? ?Objective:  ? ?Vitals:  ? 08/24/21 1427  ?BP: 126/76  ?Pulse: 88  ?SpO2: 98%  ? ?Vitals:  ? 08/24/21 1427  ?Weight: 232 lb (105.2 kg)  ?Height: 6' (1.829 m)  ? ?Body mass index is 31.46 kg/m?. ? ?General: Well Developed, well nourished, and in no acute distress.  ?Neuro: Alert and oriented x3, extra-ocular muscles intact, sensation grossly intact. Cranial nerves Andrade through XII are grossly intact, motor, sensory, and coordinative functions are intact. ?HEENT: Normocephalic, atraumatic, pupils equal round reactive to light, neck supple, no masses, no lymphadenopathy, thyroid nonpalpable. Oropharynx, nasopharynx, external ear canals are unremarkable. ?Skin: Warm and dry, no rashes noted.  ?Cardiac: Regular rate and rhythm, no murmurs rubs or gallops. No peripheral edema. Pulses symmetric. ?Respiratory: Clear to auscultation bilaterally. Not using accessory muscles, speaking in full sentences.  ?Abdominal: Soft, nontender, nondistended, positive bowel sounds, no masses, no organomegaly. ?Musculoskeletal: Shoulder, elbow, wrist, hip, knee, ankle stable, and with full range of motion. ? ?Impression and Recommendations:  ? ?The patient was counselled, risk factors were discussed, and anticipatory guidance given. ? ?Problem List Items Addressed This Visit   ? ?  ? Cardiovascular and Mediastinum  ? Primary hypertension  ?  Patient demonstrated excellent BP control over serial visits, he is asymptomatic from a cardiopulmonary standpoint, will continue current regimen until follow-up in 3 months. ? ?  ?  ?  Relevant Medications  ? amLODipine (NORVASC) 5 MG tablet  ? Other Relevant Orders  ? Apo A1 + B + Ratio  ? Comprehensive metabolic panel  ? TSH  ? Lipid panel  ?  ? Nervous and Auditory  ? Right lumbosacral radiculopathy  ?  Patient has stable symptomatology, x-rays do correlate with his symptoms, given there stability  have advised that we monitor this issue, for any change treatments to be initiated. ? ?  ?  ?  ? Musculoskeletal and Integument  ? Osteoarthritis of left knee  ?  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  We will attempt to seek authorization for viscosupplementation and have him return for that if able to do so.  Alternatively, cortisone to be considered, we will schedule a follow-up accordingly for reevaluation and to discuss these treatments.  He can continue with OTC topical analgesics over the interim ? ?  ?  ?  ? Other  ? Annual physical exam - Primary  ?  Annual examination completed, risk stratification labs ordered, anticipatory guidance provided.  We will follow labs once resulted. ? ?  ?  ? Relevant Orders  ? Apo A1 + B + Ratio  ? CBC  ? Comprehensive metabolic panel  ? Hepatitis C antibody  ? VITAMIN D 25 Hydroxy (Vit-D Deficiency, Fractures)  ? TSH  ? Lipid panel  ? HIV Antibody (routine testing w rflx)  ? Ambulatory referral to Gastroenterology  ? Need for shingles vaccine  ?  Administered today ? ?  ?  ? Relevant Orders  ? Varicella-zoster vaccine IM (Shingrix) (Completed)  ? ?Other Visit Diagnoses   ? ? Screening for HIV (human immunodeficiency virus)      ? Relevant Orders  ? HIV Antibody (routine testing w rflx)  ? Need for hepatitis C screening test      ? Relevant Orders  ? Hepatitis C antibody  ? Screening for lipoid disorders      ? Relevant Orders  ? Apo A1 + B + Ratio  ? Lipid panel  ? Low serum vitamin D      ? Relevant Orders  ? VITAMIN D 25 Hydroxy (Vit-D Deficiency, Fractures)  ? ?  ?  ? ?Orders & Medications ?Medications:  ?Meds ordered this encounter  ?Medications  ? amLODipine (NORVASC) 5 MG tablet  ?  Sig: Take 1 tablet (5 mg total) by mouth daily.  ?  Dispense:  90 tablet  ?  Refill:  0  ? ?Orders Placed This Encounter  ?Procedures  ?  Varicella-zoster vaccine IM (Shingrix)  ? Apo A1 + B + Ratio  ? CBC  ? Comprehensive metabolic panel  ? Hepatitis C antibody  ? VITAMIN D 25 Hydroxy (Vit-D Deficiency, Fractures)  ? TSH  ? Lipid panel  ? HIV Antibody (routine testing w rflx)  ? Ambulatory referral to Gastroenterology  ?  ? ?Return in about 3 months (around 11/24/2021) for BP eval.  ? ? ?[name], MD ? ? Primary Care Sports Medicine ?[facility] ?[facility]  ? ?

## 2021-08-24 NOTE — Assessment & Plan Note (Signed)
Patient has stable symptomatology, x-rays do correlate with his symptoms, given there stability have advised that we monitor this issue, for any change treatments to be initiated. ?

## 2021-08-24 NOTE — Assessment & Plan Note (Signed)
Patient demonstrated excellent BP control over serial visits, he is asymptomatic from a cardiopulmonary standpoint, will continue current regimen until follow-up in 3 months. ?

## 2021-08-24 NOTE — Assessment & Plan Note (Signed)
Administered today.

## 2021-10-25 ENCOUNTER — Other Ambulatory Visit: Payer: Self-pay

## 2021-10-25 ENCOUNTER — Other Ambulatory Visit
Admission: RE | Admit: 2021-10-25 | Discharge: 2021-10-25 | Disposition: A | Discharge status: Home / Self Care | Payer: BC Managed Care – PPO | Attending: Family Medicine | Admitting: Family Medicine

## 2021-10-25 DIAGNOSIS — I10 Essential (primary) hypertension: Secondary | ICD-10-CM | POA: Diagnosis not present

## 2021-10-25 DIAGNOSIS — Z13220 Encounter for screening for lipoid disorders: Secondary | ICD-10-CM | POA: Diagnosis not present

## 2021-10-25 DIAGNOSIS — Z0000 Encounter for general adult medical examination without abnormal findings: Secondary | ICD-10-CM

## 2021-10-25 DIAGNOSIS — R7301 Impaired fasting glucose: Secondary | ICD-10-CM

## 2021-10-25 DIAGNOSIS — Z1159 Encounter for screening for other viral diseases: Secondary | ICD-10-CM | POA: Diagnosis not present

## 2021-10-25 DIAGNOSIS — Z114 Encounter for screening for human immunodeficiency virus [HIV]: Secondary | ICD-10-CM | POA: Diagnosis not present

## 2021-10-25 DIAGNOSIS — R7989 Other specified abnormal findings of blood chemistry: Secondary | ICD-10-CM | POA: Diagnosis not present

## 2021-10-25 DIAGNOSIS — E559 Vitamin D deficiency, unspecified: Secondary | ICD-10-CM | POA: Insufficient documentation

## 2021-10-25 LAB — LIPID PANEL
Cholesterol: 195 mg/dL (ref 0–200)
HDL: 45 mg/dL (ref >40)
LDL Cholesterol: 135 mg/dL — ABNORMAL HIGH (ref 0–99)
Total CHOL/HDL Ratio: 4.3 RATIO
Triglycerides: 74 mg/dL (ref <150)
VLDL: 15 mg/dL (ref 0–40)

## 2021-10-25 LAB — COMPREHENSIVE METABOLIC PANEL
ALT: 40 U/L (ref 0–44)
AST: 32 U/L (ref 15–41)
Albumin: 4.3 g/dL (ref 3.5–5.0)
Alkaline Phosphatase: 59 U/L (ref 38–126)
Anion gap: 7 (ref 5–15)
BUN: 20 mg/dL (ref 6–20)
CO2: 22 mmol/L (ref 22–32)
Calcium: 8.9 mg/dL (ref 8.9–10.3)
Chloride: 107 mmol/L (ref 98–111)
Creatinine, Ser: 0.93 mg/dL (ref 0.61–1.24)
GFR, Estimated: >60 mL/min (ref >60)
Glucose, Bld: 104 mg/dL — ABNORMAL HIGH (ref 70–99)
Potassium: 3.8 mmol/L (ref 3.5–5.1)
Sodium: 136 mmol/L (ref 135–145)
Total Bilirubin: 1.1 mg/dL (ref 0.3–1.2)
Total Protein: 7.5 g/dL (ref 6.5–8.1)

## 2021-10-25 LAB — CBC
HCT: 44.5 % (ref 39.0–52.0)
Hemoglobin: 16.1 g/dL (ref 13.0–17.0)
MCH: 32.2 pg (ref 26.0–34.0)
MCHC: 36.2 g/dL — ABNORMAL HIGH (ref 30.0–36.0)
MCV: 89 fL (ref 80.0–100.0)
Platelets: 197 10*3/uL (ref 150–400)
RBC: 5 MIL/uL (ref 4.22–5.81)
RDW: 12.5 % (ref 11.5–15.5)
WBC: 4.3 10*3/uL (ref 4.0–10.5)
nRBC: 0 % (ref 0.0–0.2)

## 2021-10-25 LAB — VITAMIN D 25 HYDROXY (VIT D DEFICIENCY, FRACTURES): Vit D, 25-Hydroxy: 18.1 ng/mL — ABNORMAL LOW (ref 30–100)

## 2021-10-25 LAB — HEMOGLOBIN A1C
Hgb A1c MFr Bld: 5.2 % (ref 4.8–5.6)
Mean Plasma Glucose: 102.54 mg/dL

## 2021-10-25 LAB — TSH: TSH: 1.44 u[IU]/mL (ref 0.350–4.500)

## 2021-10-26 LAB — MISC LABCORP TEST (SEND OUT): Labcorp test code: 216010

## 2021-10-26 LAB — HIV ANTIBODY (ROUTINE TESTING W REFLEX): HIV Screen 4th Generation wRfx: NONREACTIVE

## 2021-10-26 LAB — HEPATITIS C ANTIBODY: HCV Ab: NONREACTIVE

## 2021-11-16 ENCOUNTER — Telehealth: Payer: Self-pay

## 2021-11-16 NOTE — Telephone Encounter (Signed)
LVM for pt to return my call. Pt has new patient appt scheduled with Dr. Servando Snare.  Office visits are not required for screening colonoscopy.  Will plan to schedule his colonoscopy.  Thanks, [place], [place]

## 2021-11-19 ENCOUNTER — Other Ambulatory Visit: Payer: Self-pay

## 2021-11-19 DIAGNOSIS — I10 Essential (primary) hypertension: Secondary | ICD-10-CM

## 2021-11-19 MED ORDER — AMLODIPINE BESYLATE 5 MG PO TABS: 5.0000 mg | ORAL_TABLET | Freq: Every day | ORAL | 0 refills | Status: DC

## 2021-11-26 ENCOUNTER — Ambulatory Visit (INDEPENDENT_AMBULATORY_CARE_PROVIDER_SITE_OTHER): Payer: BC Managed Care – PPO | Admitting: Family Medicine

## 2021-11-26 ENCOUNTER — Ambulatory Visit: Payer: BC Managed Care – PPO | Admitting: Gastroenterology

## 2021-11-26 ENCOUNTER — Telehealth: Payer: Self-pay | Admitting: Family Medicine

## 2021-11-26 ENCOUNTER — Encounter: Payer: Self-pay | Admitting: Family Medicine

## 2021-11-26 VITALS — BP 138/88 | HR 78 | Ht 72.0 in | Wt 234.2 lb

## 2021-11-26 DIAGNOSIS — R7989 Other specified abnormal findings of blood chemistry: Secondary | ICD-10-CM

## 2021-11-26 DIAGNOSIS — I10 Essential (primary) hypertension: Secondary | ICD-10-CM

## 2021-11-26 DIAGNOSIS — E7800 Pure hypercholesterolemia, unspecified: Secondary | ICD-10-CM | POA: Insufficient documentation

## 2021-11-26 MED ORDER — AMLODIPINE BESYLATE 5 MG PO TABS: 5.0000 mg | ORAL_TABLET | Freq: Every day | ORAL | 0 refills | Status: DC

## 2021-11-26 MED ORDER — VITAMIN D (ERGOCALCIFEROL) 1.25 MG (50000 UNIT) PO CAPS: 50000.0000 [IU] | ORAL_CAPSULE | ORAL | 0 refills | Status: DC

## 2021-11-26 NOTE — Telephone Encounter (Signed)
Copied from CRM [id number]. Topic: General - Other >> Nov 26, 2021  4:28 PM Everette C wrote: Reason for CRM: Medication Refill - Medication: Vitamin D, Ergocalciferol, (DRISDOL) 1.25 MG (50000 UNIT) CAPS capsule [[id number]]   amLODipine (NORVASC) 5 MG tablet [[id number]] - patient has 5 tablets remaining   Has the patient contacted their pharmacy? Yes.  The patient's pharmacy has made contact on their behalf  (Agent: If no, request that the patient contact the pharmacy for the refill. If patient does not wish to contact the pharmacy document the reason why and proceed with request.) (Agent: If yes, when and what did the pharmacy advise?)  Preferred Pharmacy (with phone number or street name): [facility] ([facility]) - [place], [place] - [address] [place]  [address] Phone: [phone] Fax: [phone] Hours: Not open 24 hours   Has the patient been seen for an appointment in the last year OR does the patient have an upcoming appointment? Yes.    Agent: Please be advised that RX refills may take up to 3 business days. We ask that you follow-up with your pharmacy.

## 2021-11-26 NOTE — Progress Notes (Signed)
     Primary Care / Sports Medicine Office Visit  Patient Information:  Patient ID: Jared Andrade AGE, male DOB: 09-19-1970 Age: 51 y.o. MRN: [id number]   Jared Andrade is a pleasant 51 y.o. male presenting with the following:  Chief Complaint  Patient presents with   Hypertension    Vitals:   11/26/21 1441  BP: 138/88  Pulse: 78  SpO2: 98%   Vitals:   11/26/21 1441  Weight: 234 lb 3.2 oz (106.2 kg)  Height: 6' (1.829 m)   Body mass index is 31.76 kg/m.  No results found.   Independent interpretation of notes and tests performed by another provider:   None  Procedures performed:   None  Pertinent History, Exam, Impression, and Recommendations:   Problem List Items Addressed This Visit       Cardiovascular and Mediastinum   Primary hypertension - Primary    Interval progression, he is asymptomatic, cardiopulmonary examination findings are benign today.  I have encouraged continued lifestyle modifications and we will coordinate a follow-up in 6 months.      Relevant Medications   amLODipine (NORVASC) 5 MG tablet     Other   Low serum vitamin D    Very low vitamin D, plan for Rx course to bring the patient back into range, we will plan to recheck this at his next annual physical.      Relevant Medications   Vitamin D, Ergocalciferol, (DRISDOL) 1.25 MG (50000 UNIT) CAPS capsule   Pure hypercholesterolemia    Newly noted mildly elevated LDL and isolation, heavy focus on lifestyle modifications given concomitant hypertension.      Relevant Medications   amLODipine (NORVASC) 5 MG tablet     Orders & Medications Meds ordered this encounter  Medications   amLODipine (NORVASC) 5 MG tablet    Sig: Take 1 tablet (5 mg total) by mouth daily.    Dispense:  90 tablet    Refill:  0   Vitamin D, Ergocalciferol, (DRISDOL) 1.25 MG (50000 UNIT) CAPS capsule    Sig: Take 1 capsule (50,000 Units total) by mouth every 7 (seven) days. Take for 8 total doses(weeks)     Dispense:  8 capsule    Refill:  0   No orders of the defined types were placed in this encounter.    Return in about 6 months (around 05/29/2022).     Jared Banana, MD   Primary Care Sports Medicine [facility]

## 2021-11-26 NOTE — Assessment & Plan Note (Signed)
Interval progression, he is asymptomatic, cardiopulmonary examination findings are benign today.  I have encouraged continued lifestyle modifications and we will coordinate a follow-up in 6 months.

## 2021-11-26 NOTE — Addendum Note (Signed)
Addended by: Bronson Ing on: 11/26/2021 04:35 PM   Modules accepted: Orders

## 2021-11-26 NOTE — Assessment & Plan Note (Signed)
Very low vitamin D, plan for Rx course to bring the patient back into range, we will plan to recheck this at his next annual physical.

## 2021-11-26 NOTE — Telephone Encounter (Signed)
Already sent by provider on 11/26/21 to [facility].

## 2021-11-26 NOTE — Assessment & Plan Note (Signed)
Newly noted mildly elevated LDL and isolation, heavy focus on lifestyle modifications given concomitant hypertension.

## 2021-11-26 NOTE — Patient Instructions (Signed)
-   Start Rx vitamin D weekly x8 weeks - Continue daily blood pressure medication (contact us for any issues with getting this filled) - Review information on healthy lifestyle/dietary changes - Return for follow-up in 6 months, contact our office for any questions/concerns between now and then

## 2022-01-03 ENCOUNTER — Other Ambulatory Visit: Payer: Self-pay

## 2022-01-03 DIAGNOSIS — R7989 Other specified abnormal findings of blood chemistry: Secondary | ICD-10-CM

## 2022-01-03 MED ORDER — VITAMIN D (ERGOCALCIFEROL) 1.25 MG (50000 UNIT) PO CAPS: 50000.0000 [IU] | ORAL_CAPSULE | ORAL | 0 refills | Status: DC

## 2022-01-26 ENCOUNTER — Other Ambulatory Visit: Payer: Self-pay | Admitting: Family Medicine

## 2022-01-26 DIAGNOSIS — R7989 Other specified abnormal findings of blood chemistry: Secondary | ICD-10-CM

## 2022-01-28 NOTE — Telephone Encounter (Signed)
Requested medications are due for refill today.  no  Requested medications are on the active medications list.  yes  Last refill. 01/03/2022 #8 0 rf  Future visit scheduled.   yes  Notes to clinic.  Refill not delegated.     Requested Prescriptions  Pending Prescriptions Disp Refills   Vitamin D, Ergocalciferol, (DRISDOL) 1.25 MG (50000 UNIT) CAPS capsule [Pharmacy Med Name: Vitamin D (Ergocalciferol) 1.25 MG (50000 UT) Oral Capsule] 8 capsule 5    Sig: TAKE 1 CAPSULE BY MOUTH EVERY 7  DAYS FOR 8 TOTAL DOSES (WEEKS)     Endocrinology:  Vitamins - Vitamin D Supplementation 2 Failed - 01/26/2022 10:15 PM      Failed - Manual Review: Route requests for 50,000 IU strength to the provider      Failed - Vitamin D in normal range and within 360 days    Vit D, 25-Hydroxy  Date Value Ref Range Status  10/25/2021 18.10 (L) 30 - 100 ng/mL Final    Comment:    (NOTE) Vitamin D deficiency has been defined by the [place] practice guideline as a level of serum 25-OH  vitamin D less than 20 ng/mL (1,2). The Endocrine Society went on to  further define vitamin D insufficiency as a level between 21 and 29  ng/mL (2).  1. IOM (Institute of Medicine). 2010. Dietary reference intakes for  calcium and D. [place]: The [employer]. 2. Holick MF, Binkley Harmon, Bischoff-Ferrari HA, et al. Evaluation,  treatment, and prevention of vitamin D deficiency: an Endocrine  Society clinical practice guideline, JCEM. 2011 Jul; 96(7): 1911-30.  Performed at [facility], [place] [address] [place], [place] [address]          Passed - Ca in normal range and within 360 days    Calcium  Date Value Ref Range Status  10/25/2021 8.9 8.9 - 10.3 mg/dL Final         Passed - Valid encounter within last 12 months    Recent Outpatient Visits           2 months ago Primary hypertension   [place] [facility] at [place], Earley Abide, MD   5 months ago Annual physical exam   [facility] and [facility] Medicine at [facility], Earley Abide, MD   6 months ago Primary hypertension    Primary Care and Sports Medicine at [facility], Earley Abide, MD       Future Appointments             In 4 months Zigmund Daniel, Earley Abide, MD [place] at [facility], [facility]

## 2022-01-29 ENCOUNTER — Other Ambulatory Visit: Payer: Self-pay

## 2022-01-29 DIAGNOSIS — I10 Essential (primary) hypertension: Secondary | ICD-10-CM

## 2022-01-29 MED ORDER — AMLODIPINE BESYLATE 5 MG PO TABS: 5.0000 mg | ORAL_TABLET | Freq: Every day | ORAL | 1 refills | Status: DC

## 2022-05-14 ENCOUNTER — Encounter: Payer: Self-pay | Admitting: Family Medicine

## 2022-05-14 ENCOUNTER — Ambulatory Visit (INDEPENDENT_AMBULATORY_CARE_PROVIDER_SITE_OTHER): Payer: BC Managed Care – PPO | Admitting: Family Medicine

## 2022-05-14 VITALS — BP 140/100 | HR 74 | Ht 72.0 in | Wt 233.0 lb

## 2022-05-14 DIAGNOSIS — M1712 Unilateral primary osteoarthritis, left knee: Secondary | ICD-10-CM | POA: Diagnosis not present

## 2022-05-14 DIAGNOSIS — I10 Essential (primary) hypertension: Secondary | ICD-10-CM | POA: Diagnosis not present

## 2022-05-14 DIAGNOSIS — Z1211 Encounter for screening for malignant neoplasm of colon: Secondary | ICD-10-CM

## 2022-05-14 MED ORDER — MELOXICAM 15 MG PO TABS: 15.0000 mg | ORAL_TABLET | Freq: Every day | ORAL | 0 refills | Status: DC | PRN

## 2022-05-14 MED ORDER — AMLODIPINE-OLMESARTAN 5-20 MG PO TABS: 1.0000 | ORAL_TABLET | Freq: Every day | ORAL | 0 refills | Status: DC

## 2022-05-14 NOTE — Assessment & Plan Note (Signed)
Chronic, ongoing symptomatology, reviewed various treatment strategies and he is amenable to evaluation for viscosupplementation.  Will place authorization request, over interim as needed meloxicam advised, did also bring up alternate strategy including PRP and duloxetine.  He will consider these if viscosupplementation not covered.

## 2022-05-14 NOTE — Assessment & Plan Note (Signed)
Chronic, without adequate control, benign cardiopulmonary findings today.  Given repeat BP recheck remaining elevated despite compliance with medication, titrate from amlodipine 5 mg to combo amlodipine-olmesartan 5-20 mg tablet daily, we will coordinate follow-up to reassess this.

## 2022-05-14 NOTE — Progress Notes (Signed)
     Primary Care / Sports Medicine Office Visit  Patient Information:  Patient ID: Jared Andrade, male DOB: 1971/03/08 Age: 52 y.o. MRN: [id number]   Jared Andrade is a pleasant 52 y.o. male presenting with the following:  Chief Complaint  Patient presents with   Knee Pain    Left For 1 month, had surgery.     Vitals:   05/14/22 1501 05/14/22 1530  BP: (!) 140/100 (!) 140/100  Pulse: 74   SpO2: 98%    Vitals:   05/14/22 1501  Weight: 233 lb (105.7 kg)  Height: 6' (1.829 m)   Body mass index is 31.6 kg/m.  No results found.   Independent interpretation of notes and tests performed by another provider:   None  Procedures performed:   None  Pertinent History, Exam, Impression, and Recommendations:   [name] was seen today for knee pain.  Primary hypertension Assessment & Plan: Chronic, without adequate control, benign cardiopulmonary findings today.  Given repeat BP recheck remaining elevated despite compliance with medication, titrate from amlodipine 5 mg to combo amlodipine-olmesartan 5-20 mg tablet daily, we will coordinate follow-up to reassess this.  Orders: -     amLODIPine-Olmesartan; Take 1 tablet by mouth daily.  Dispense: 90 tablet; Refill: 0  Osteoarthritis of left knee, unspecified osteoarthritis type Assessment & Plan: Chronic, ongoing symptomatology, reviewed various treatment strategies and he is amenable to evaluation for viscosupplementation.  Will place authorization request, over interim as needed meloxicam advised, did also bring up alternate strategy including PRP and duloxetine.  He will consider these if viscosupplementation not covered.  Orders: -     Meloxicam; Take 1 tablet (15 mg total) by mouth daily as needed for pain.  Dispense: 30 tablet; Refill: 0  Colon cancer screening -     Ambulatory referral to Gastroenterology     Orders & Medications Meds ordered this encounter  Medications   meloxicam (MOBIC) 15 MG tablet     Sig: Take 1 tablet (15 mg total) by mouth daily as needed for pain.    Dispense:  30 tablet    Refill:  0   amLODipine-olmesartan (AZOR) 5-20 MG tablet    Sig: Take 1 tablet by mouth daily.    Dispense:  90 tablet    Refill:  0   Orders Placed This Encounter  Procedures   Ambulatory referral to Gastroenterology     Return if symptoms worsen or fail to improve.     [name], MD, [facility]   Primary Care Sports Medicine Primary Care and Sports Medicine at [facility]

## 2022-05-14 NOTE — Patient Instructions (Addendum)
-  Take new combo blood pressure pill daily (stop old pill amlodipine) - Can dose meloxicam as needed sparingly for knee pain - We will reach out to you regarding gel injection for knee and associated follow-up - Referral coordinator will contact you to schedule colonoscopy - Reach out for any questions

## 2022-05-27 ENCOUNTER — Telehealth: Payer: Self-pay

## 2022-05-27 ENCOUNTER — Other Ambulatory Visit: Payer: Self-pay

## 2022-05-27 DIAGNOSIS — Z1211 Encounter for screening for malignant neoplasm of colon: Secondary | ICD-10-CM

## 2022-05-27 MED ORDER — CLENPIQ 10-3.5-12 MG-GM -GM/160ML PO SOLN: 1.0000 | Freq: Once | ORAL | 0 refills | Status: AC

## 2022-05-27 NOTE — Telephone Encounter (Signed)
Gastroenterology Pre-Procedure Review  Request Date: 07/19/22 Requesting Physician: Dr. Allen Norris  PATIENT REVIEW QUESTIONS: The patient responded to the following health history questions as indicated:    1. Are you having any GI issues? no 2. Do you have a personal history of Polyps? no 3. Do you have a family history of Colon Cancer or Polyps? yes (2ND Cousin colon poly) 4. Diabetes Mellitus? no 5. Joint replacements in the past 12 months?no 6. Major health problems in the past 3 months?no 7. Any artificial heart valves, MVP, or defibrillator?no    MEDICATIONS & ALLERGIES:    Patient reports the following regarding taking any anticoagulation/antiplatelet therapy:   Plavix, Coumadin, Eliquis, Xarelto, Lovenox, Pradaxa, Brilinta, or Effient? no Aspirin? no  Patient confirms/reports the following medications:  Current Outpatient Medications  Medication Sig Dispense Refill   amLODipine-olmesartan (AZOR) 5-20 MG tablet Take 1 tablet by mouth daily. 90 tablet 0   meloxicam (MOBIC) 15 MG tablet Take 1 tablet (15 mg total) by mouth daily as needed for pain. 30 tablet 0   naproxen sodium (ALEVE) 220 MG tablet Take 220 mg by mouth.     Vitamin D, Ergocalciferol, (DRISDOL) 1.25 MG (50000 UNIT) CAPS capsule Take 1 capsule (50,000 Units total) by mouth every 7 (seven) days. Take for 8 total doses(weeks) 8 capsule 0   No current facility-administered medications for this visit.    Patient confirms/reports the following allergies:  No Known Allergies  No orders of the defined types were placed in this encounter.   AUTHORIZATION INFORMATION Primary Insurance: 1D#: Group #:  Secondary Insurance: 1D#: Group #:  SCHEDULE INFORMATION: Date: 07/19/22 Time: Location: [place]

## 2022-05-30 ENCOUNTER — Ambulatory Visit: Payer: BC Managed Care – PPO | Admitting: Family Medicine

## 2022-06-06 ENCOUNTER — Telehealth: Payer: Self-pay | Admitting: *Deleted

## 2022-06-06 NOTE — Telephone Encounter (Signed)
Requesting new Rx- missing information- or unclear information:  Mono visc or ortho visc- both listed- need specific treatment Missing signature and date  Fax: [phone]- [facility]

## 2022-06-07 NOTE — Telephone Encounter (Signed)
faxed

## 2022-06-07 NOTE — Telephone Encounter (Signed)
Requesting a new script with PCP signature and date for the following medications orthovisc and monovisc

## 2022-06-10 ENCOUNTER — Other Ambulatory Visit: Payer: Self-pay | Admitting: Family Medicine

## 2022-06-10 DIAGNOSIS — M1712 Unilateral primary osteoarthritis, left knee: Secondary | ICD-10-CM

## 2022-06-11 NOTE — Telephone Encounter (Signed)
Requested medication (s) are due for refill today - yes  Requested medication (s) are on the active medication list -yes  Future visit scheduled -no  Last refill: 05/14/22 #30  Notes to clinic: original Rx- written with no RF- is this something provider wants to continue?  Requested Prescriptions  Pending Prescriptions Disp Refills   meloxicam (MOBIC) 15 MG tablet [Pharmacy Med Name: MELOXICAM '15MG'$  TABLETS] 30 tablet 0    Sig: TAKE 1 TABLET(15 MG) BY MOUTH DAILY AS NEEDED FOR PAIN     Analgesics:  COX2 Inhibitors Failed - 06/10/2022  3:36 AM      Failed - Manual Review: Labs are only required if the patient has taken medication for more than 8 weeks.      Passed - HGB in normal range and within 360 days    Hemoglobin  Date Value Ref Range Status  10/25/2021 16.1 13.0 - 17.0 g/dL Final         Passed - Cr in normal range and within 360 days    Creatinine, Ser  Date Value Ref Range Status  10/25/2021 0.93 0.61 - 1.24 mg/dL Final         Passed - HCT in normal range and within 360 days    HCT  Date Value Ref Range Status  10/25/2021 44.5 39.0 - 52.0 % Final         Passed - AST in normal range and within 360 days    AST  Date Value Ref Range Status  10/25/2021 32 15 - 41 U/L Final         Passed - ALT in normal range and within 360 days    ALT  Date Value Ref Range Status  10/25/2021 40 0 - 44 U/L Final         Passed - eGFR is 30 or above and within 360 days    GFR, Estimated  Date Value Ref Range Status  10/25/2021 >60 >60 mL/min Final    Comment:    (NOTE) Calculated using the CKD-EPI Creatinine Equation (2021)          Passed - Patient is not pregnant      Passed - Valid encounter within last 12 months    Recent Outpatient Visits           4 weeks ago Primary hypertension   [place] [facility] at [place], Earley Abide, MD   6 months ago Primary hypertension   [place] at [place], Earley Abide, MD   9 months ago Annual physical exam   [place] at [place], Earley Abide, MD   10 months ago Primary hypertension   [place] at [facility], Earley Abide, MD                 Requested Prescriptions  Pending Prescriptions Disp Refills   meloxicam (MOBIC) 15 MG tablet [Pharmacy Med Name: MELOXICAM '15MG'$  TABLETS] 30 tablet 0    Sig: TAKE 1 TABLET(15 MG) BY MOUTH DAILY AS NEEDED FOR PAIN     Analgesics:  COX2 Inhibitors Failed - 06/10/2022  3:36 AM      Failed - Manual Review: Labs are only required if the patient has taken medication for more than 8 weeks.      Passed - HGB in normal range and within 360 days  Hemoglobin  Date Value Ref Range Status  10/25/2021 16.1 13.0 - 17.0 g/dL Final         Passed - Cr in normal range and within 360 days    Creatinine, Ser  Date Value Ref Range Status  10/25/2021 0.93 0.61 - 1.24 mg/dL Final         Passed - HCT in normal range and within 360 days    HCT  Date Value Ref Range Status  10/25/2021 44.5 39.0 - 52.0 % Final         Passed - AST in normal range and within 360 days    AST  Date Value Ref Range Status  10/25/2021 32 15 - 41 U/L Final         Passed - ALT in normal range and within 360 days    ALT  Date Value Ref Range Status  10/25/2021 40 0 - 44 U/L Final         Passed - eGFR is 30 or above and within 360 days    GFR, Estimated  Date Value Ref Range Status  10/25/2021 >60 >60 mL/min Final    Comment:    (NOTE) Calculated using the CKD-EPI Creatinine Equation (2021)          Passed - Patient is not pregnant      Passed - Valid encounter within last 12 months    Recent Outpatient Visits           4 weeks ago Primary hypertension   [place] [facility] at [place], Earley Abide, MD   6 months ago Primary hypertension   [place] at [place], Earley Abide, MD   9 months ago Annual physical exam   [place] at [facility], Earley Abide, MD   10 months ago Primary hypertension   [place] at [facility], Earley Abide, MD                m

## 2022-06-27 NOTE — Telephone Encounter (Signed)
Jared Andrade with [employer] called to let patient know they could enroll patient into discount program for monovisc making patient's cost 715.00. Please reach out to [place] if you want them to proceed with contacting the patient

## 2022-07-09 ENCOUNTER — Encounter: Payer: Self-pay | Admitting: Gastroenterology

## 2022-07-19 ENCOUNTER — Ambulatory Visit: Payer: BC Managed Care – PPO | Admitting: Anesthesiology

## 2022-07-19 ENCOUNTER — Encounter
Admission: RE | Disposition: A | Discharge status: Home / Self Care | Payer: Self-pay | Source: Home / Self Care | Attending: Gastroenterology

## 2022-07-19 ENCOUNTER — Ambulatory Visit
Admission: RE | Admit: 2022-07-19 | Discharge: 2022-07-19 | Disposition: A | Discharge status: Home / Self Care | Payer: BC Managed Care – PPO | Attending: Gastroenterology | Admitting: Gastroenterology

## 2022-07-19 ENCOUNTER — Encounter: Payer: Self-pay | Admitting: Gastroenterology

## 2022-07-19 ENCOUNTER — Other Ambulatory Visit: Payer: Self-pay

## 2022-07-19 DIAGNOSIS — K5730 Diverticulosis of large intestine without perforation or abscess without bleeding: Secondary | ICD-10-CM | POA: Diagnosis not present

## 2022-07-19 DIAGNOSIS — K640 First degree hemorrhoids: Secondary | ICD-10-CM | POA: Insufficient documentation

## 2022-07-19 DIAGNOSIS — Z1211 Encounter for screening for malignant neoplasm of colon: Secondary | ICD-10-CM | POA: Insufficient documentation

## 2022-07-19 DIAGNOSIS — M1990 Unspecified osteoarthritis, unspecified site: Secondary | ICD-10-CM | POA: Insufficient documentation

## 2022-07-19 DIAGNOSIS — I10 Essential (primary) hypertension: Secondary | ICD-10-CM | POA: Diagnosis not present

## 2022-07-19 DIAGNOSIS — Z7689 Persons encountering health services in other specified circumstances: Secondary | ICD-10-CM

## 2022-07-19 DIAGNOSIS — M549 Dorsalgia, unspecified: Secondary | ICD-10-CM | POA: Diagnosis not present

## 2022-07-19 HISTORY — DX: Unspecified osteoarthritis, unspecified site: M19.90

## 2022-07-19 HISTORY — PX: COLONOSCOPY WITH PROPOFOL: SHX5780

## 2022-07-19 SURGERY — COLONOSCOPY WITH PROPOFOL
Anesthesia: General | Site: Rectum

## 2022-07-19 MED ORDER — LIDOCAINE HCL (CARDIAC) PF 100 MG/5ML IV SOSY
PREFILLED_SYRINGE | INTRAVENOUS | Status: DC | PRN
  Administered 2022-07-19: 30 mg via INTRATRACHEAL

## 2022-07-19 MED ORDER — PROPOFOL 10 MG/ML IV BOLUS
INTRAVENOUS | Status: DC | PRN
  Administered 2022-07-19 (×4): 50 mg via INTRAVENOUS

## 2022-07-19 MED ORDER — LACTATED RINGERS IV SOLN: INTRAVENOUS | Status: DC

## 2022-07-19 MED ORDER — SODIUM CHLORIDE 0.9 % IV SOLN: INTRAVENOUS | Status: DC

## 2022-07-19 MED ORDER — STERILE WATER FOR IRRIGATION IR SOLN
Status: DC | PRN
  Administered 2022-07-19: 1000 mL

## 2022-07-19 SURGICAL SUPPLY — 21 items

## 2022-07-19 NOTE — Op Note (Signed)
[facility] Gastroenterology Patient Name: Jared PeperGary Freitas Procedure Date: 07/19/2022 8:58 AM MRN: [id number] Account #: [id number] Date of Birth: 25-Jul-1970 Admit Type: Outpatient Age: 52 [facility] Gender: Male Note Status: Finalized Instrument Name: [id number] Procedure:             Colonoscopy Indications:           Screening for colorectal malignant neoplasm Providers:             Midge Miniumarren Kendle Turbin MD, MD Medicines:             Propofol per Anesthesia Complications:         No immediate complications. Procedure:             Pre-Anesthesia Assessment:                        - Prior to the procedure, a History and Physical was                         performed, and patient medications and allergies were                         reviewed. The patient's tolerance of previous                         anesthesia was also reviewed. The risks and benefits                         of the procedure and the sedation options and risks                         were discussed with the patient. All questions were                         answered, and informed consent was obtained. Prior                         Anticoagulants: The patient has taken no anticoagulant                         or antiplatelet agents. ASA Grade Assessment: II - A                         patient with mild systemic disease. After reviewing                         the risks and benefits, the patient was deemed in                         satisfactory condition to undergo the procedure.                        After obtaining informed consent, the colonoscope was                         passed under direct vision. Throughout the procedure,                         the patient's blood pressure,  pulse, and oxygen                         saturations were monitored continuously. The                         Colonoscope was introduced through the anus and                         advanced to the the cecum, identified  by appendiceal                         orifice and ileocecal valve. The colonoscopy was                         performed without difficulty. The patient tolerated                         the procedure well. The quality of the bowel                         preparation was excellent. Findings:      The perianal and digital rectal examinations were normal.      Multiple small-mouthed diverticula were found in the sigmoid colon and       ascending colon.      Non-bleeding internal hemorrhoids were found during retroflexion. The       hemorrhoids were Grade I (internal hemorrhoids that do not prolapse). Impression:            - Diverticulosis in the sigmoid colon and in the                         ascending colon.                        - Non-bleeding internal hemorrhoids.                        - No specimens collected. Recommendation:        - Discharge patient to home.                        - Resume previous diet.                        - Continue present medications.                        - Repeat colonoscopy in 10 years for screening                         purposes. Procedure Code(s):     --- Professional ---                        [id number], Colonoscopy, flexible; diagnostic, including                         collection of specimen(s) by brushing or washing, when                         performed (  separate procedure) Diagnosis Code(s):     --- Professional ---                        Z12.11, Encounter for screening for malignant neoplasm                         of colon CPT copyright 2022 American Medical Association. All rights reserved. The codes documented in this report are preliminary and upon coder review may  be revised to meet current compliance requirements. Midge Minium MD, MD 07/19/2022 9:20:21 AM This report has been signed electronically. Number of Addenda: 0 Note Initiated On: 07/19/2022 8:58 AM Scope Withdrawal Time: 0 hours 7 minutes 57 seconds  Total Procedure  Duration: 0 hours 10 minutes 4 seconds  Estimated Blood Loss:  Estimated blood loss: none.      [facility]

## 2022-07-19 NOTE — Anesthesia Preprocedure Evaluation (Signed)
Anesthesia Evaluation  Patient identified by MRN, date of birth, ID band Patient awake    Reviewed: Allergy & Precautions, H&P , NPO status , Patient's Chart, lab work & pertinent test results  Airway Mallampati: III  TM Distance: >3 FB Neck ROM: Full    Dental no notable dental hx.    Pulmonary neg pulmonary ROS   Pulmonary exam normal breath sounds clear to auscultation       Cardiovascular hypertension, Normal cardiovascular exam Rhythm:Regular Rate:Normal     Neuro/Psych Back pain negative neurological ROS  negative psych ROS   GI/Hepatic negative GI ROS, Neg liver ROS,,,  Endo/Other  negative endocrine ROS    Renal/GU negative Renal ROS  negative genitourinary   Musculoskeletal  (+) Arthritis , Osteoarthritis,    Abdominal Normal abdominal exam  (+)   Peds negative pediatric ROS (+)  Hematology negative hematology ROS (+)   Anesthesia Other Findings   Reproductive/Obstetrics negative OB ROS                             Anesthesia Physical Anesthesia Plan  ASA: 2  Anesthesia Plan: General   Post-op Pain Management:    Induction: Intravenous  PONV Risk Score and Plan:   Airway Management Planned:   Additional Equipment:   Intra-op Plan:   Post-operative Plan:   Informed Consent: I have reviewed the patients History and Physical, chart, labs and discussed the procedure including the risks, benefits and alternatives for the proposed anesthesia with the patient or authorized representative who has indicated his/her understanding and acceptance.       Plan Discussed with: CRNA  Anesthesia Plan Comments:        Anesthesia Quick Evaluation

## 2022-07-19 NOTE — Transfer of Care (Signed)
Immediate Anesthesia Transfer of Care Note  Patient: Jared Andrade  Procedure(s) Performed: COLONOSCOPY WITH PROPOFOL (Rectum)  Patient Location: PACU  Anesthesia Type: General  Level of Consciousness: awake, alert  and patient cooperative  Airway and Oxygen Therapy: Patient Spontanous Breathing and Patient connected to supplemental oxygen  Post-op Assessment: Post-op Vital signs reviewed, Patient's Cardiovascular Status Stable, Respiratory Function Stable, Patent Airway and No signs of Nausea or vomiting  Post-op Vital Signs: Reviewed and stable  Complications: No notable events documented.

## 2022-07-19 NOTE — Anesthesia Postprocedure Evaluation (Signed)
Anesthesia Post Note  Patient: Jared Andrade  Procedure(s) Performed: COLONOSCOPY WITH PROPOFOL (Rectum)  Patient location during evaluation: PACU Anesthesia Type: General Level of consciousness: awake and alert Pain management: pain level controlled Vital Signs Assessment: post-procedure vital signs reviewed and stable Respiratory status: spontaneous breathing, nonlabored ventilation, respiratory function stable and patient connected to nasal cannula oxygen Cardiovascular status: blood pressure returned to baseline and stable Postop Assessment: no apparent nausea or vomiting Anesthetic complications: no   No notable events documented.   Last Vitals:  Vitals:   07/19/22 0925 07/19/22 0930  BP:  114/82  Pulse: 73 75  Resp: 12 14  Temp:  36.6 C  SpO2: 97% 97%    Last Pain:  Vitals:   07/19/22 0930  TempSrc:   PainSc: 0-No pain                 [place] C [name]

## 2022-07-19 NOTE — H&P (Signed)
   Midge Minium, MD [facility] [address] [place], [place] [address] Phone: [phone] Fax : [phone]  Primary Care Physician:  [name], MD Primary Gastroenterologist:  Dr. Servando Snare  Pre-Procedure History & Physical: HPI:  Jared Andrade is a 52 y.o. male is here for a screening colonoscopy.   Past Medical History:  Diagnosis Date   Arthritis    left knee   Hypertension     Past Surgical History:  Procedure Laterality Date   KNEE SURGERY Left    NOSE SURGERY      Prior to Admission medications   Medication Sig Start Date End Date Taking? Authorizing Provider  amLODipine-olmesartan (AZOR) 5-20 MG tablet Take 1 tablet by mouth daily. 05/14/22  Yes [name], MD  naproxen sodium (ALEVE) 220 MG tablet Take 220 mg by mouth.   Yes [provider]    Allergies as of 05/27/2022   (No Known Allergies)    Family History  Problem Relation Age of Onset   Cancer Mother    Cancer Father    Drug abuse Sister     Social History   Socioeconomic History   Marital status: Married    Spouse name: Not on file   Number of children: Not on file   Years of education: Not on file   Highest education level: Not on file  Occupational History   Not on file  Tobacco Use   Smoking status: Never   Smokeless tobacco: Never  Vaping Use   Vaping Use: Never used  Substance and Sexual Activity   Alcohol use: Yes    Comment: 1-2 drinks per month   Drug use: Never   Sexual activity: Yes  Other Topics Concern   Not on file  Social History Narrative   Not on file   Social Determinants of Health   Financial Resource Strain: Not on file  Food Insecurity: Not on file  Transportation Needs: Not on file  Physical Activity: Not on file  Stress: Not on file  Social Connections: Not on file  Intimate Partner Violence: Not on file    Review of Systems: See HPI, otherwise negative ROS  Physical Exam: BP (!) 135/95   Pulse 88   Temp 97.9 F (36.6  C) (Temporal)   Ht 6' (1.829 m)   Wt 105.7 kg   SpO2 99%   BMI 31.60 kg/m  General:   Alert,  pleasant and cooperative in NAD Head:  Normocephalic and atraumatic. Neck:  Supple; no masses or thyromegaly. Lungs:  Clear throughout to auscultation.    Heart:  Regular rate and rhythm. Abdomen:  Soft, nontender and nondistended. Normal bowel sounds, without guarding, and without rebound.   Neurologic:  Alert and  oriented x4;  grossly normal neurologically.  Impression/Plan: Jared Andrade is now here to undergo a screening colonoscopy.  Risks, benefits, and alternatives regarding colonoscopy have been reviewed with the patient.  Questions have been answered.  All parties agreeable.

## 2022-07-22 ENCOUNTER — Encounter: Payer: Self-pay | Admitting: Gastroenterology

## 2022-07-24 ENCOUNTER — Other Ambulatory Visit: Payer: Self-pay | Admitting: Family Medicine

## 2022-07-24 DIAGNOSIS — I10 Essential (primary) hypertension: Secondary | ICD-10-CM

## 2022-07-25 NOTE — Telephone Encounter (Signed)
Requested Prescriptions  Pending Prescriptions Disp Refills   amLODipine-olmesartan (AZOR) 5-20 MG tablet [Pharmacy Med Name: AMLOD/OLMESA 5-20MG  TABLET] 90 tablet 1    Sig: TAKE 1 TABLET BY MOUTH DAILY     Cardiovascular: CCB + ARB Combos Failed - 07/24/2022  7:32 AM      Failed - K in normal range and within 180 days    Potassium  Date Value Ref Range Status  10/25/2021 3.8 3.5 - 5.1 mmol/L Final         Failed - Cr in normal range and within 180 days    Creatinine, Ser  Date Value Ref Range Status  10/25/2021 0.93 0.61 - 1.24 mg/dL Final         Failed - Na in normal range and within 180 days    Sodium  Date Value Ref Range Status  10/25/2021 136 135 - 145 mmol/L Final         Passed - Patient is not pregnant      Passed - Last BP in normal range    BP Readings from Last 1 Encounters:  07/19/22 114/82         Passed - Valid encounter within last 6 months    Recent Outpatient Visits           2 months ago Primary hypertension   [place] [facility], Ocie Bob, MD   8 months ago Primary hypertension   [place] [facility] at [facility], Ocie Bob, MD   11 months ago Annual physical exam   [facility] at [facility], Ocie Bob, MD   12 months ago Primary hypertension   [facility] [facility] & [facility] Medicine at [facility], Ocie Bob, MD

## 2023-01-08 ENCOUNTER — Other Ambulatory Visit: Payer: Self-pay | Admitting: Family Medicine

## 2023-01-08 DIAGNOSIS — I10 Essential (primary) hypertension: Secondary | ICD-10-CM

## 2023-01-08 NOTE — Telephone Encounter (Signed)
Requested Prescriptions  Pending Prescriptions Disp Refills   amLODipine-olmesartan (AZOR) 5-20 MG tablet [Pharmacy Med Name: AMLOD/OLMESA 5-20MG  TABLET] 90 tablet 3    Sig: TAKE 1 TABLET BY MOUTH DAILY     Cardiovascular: CCB + ARB Combos Failed - 01/08/2023  5:35 AM      Failed - K in normal range and within 180 days    Potassium  Date Value Ref Range Status  10/25/2021 3.8 3.5 - 5.1 mmol/L Final         Failed - Cr in normal range and within 180 days    Creatinine, Ser  Date Value Ref Range Status  10/25/2021 0.93 0.61 - 1.24 mg/dL Final         Failed - Na in normal range and within 180 days    Sodium  Date Value Ref Range Status  10/25/2021 136 135 - 145 mmol/L Final         Failed - Valid encounter within last 6 months    Recent Outpatient Visits           7 months ago Primary hypertension   [place] [facility] at [facility], Ocie Bob, MD   1 year ago Primary hypertension   [place] [facility] at [facility], Ocie Bob, MD   1 year ago Annual physical exam   [facility] at [facility], Ocie Bob, MD   1 year ago Primary hypertension   [place] [facility] at [facility], Ocie Bob, MD              Passed - Patient is not pregnant      Passed - Last BP in normal range    BP Readings from Last 1 Encounters:  07/19/22 114/82

## 2023-01-21 ENCOUNTER — Encounter: Payer: Self-pay | Admitting: Family Medicine

## 2023-01-21 ENCOUNTER — Ambulatory Visit (INDEPENDENT_AMBULATORY_CARE_PROVIDER_SITE_OTHER): Payer: BC Managed Care – PPO | Admitting: Family Medicine

## 2023-01-21 VITALS — BP 128/88 | HR 80 | Ht 72.0 in | Wt 248.0 lb

## 2023-01-21 DIAGNOSIS — Z7689 Persons encountering health services in other specified circumstances: Secondary | ICD-10-CM

## 2023-01-21 MED ORDER — PHENTERMINE HCL 15 MG PO CAPS
15.0000 mg | ORAL_CAPSULE | ORAL | 2 refills | Status: DC
Start: 2023-01-21 — End: 2023-03-24

## 2023-01-21 MED ORDER — TOPIRAMATE 50 MG PO TABS
50.0000 mg | ORAL_TABLET | Freq: Every day | ORAL | 0 refills | Status: DC
Start: 2023-01-21 — End: 2023-03-02

## 2023-01-21 NOTE — Patient Instructions (Signed)
-   Start phentermine to 15 mg daily (take in AM on empty stomach) - Start topiramate in stepwise manner

## 2023-01-21 NOTE — Progress Notes (Signed)
     Primary Care / Sports Medicine Office Visit  Patient Information:  Patient ID: STARR ENGEL, male DOB: 14-Dec-1970 Age: 52 y.o. MRN: [id number]   YU PEGGS II is a pleasant 52 y.o. male presenting with the following:  Chief Complaint  Patient presents with   Weight Management Screening   Hypertension    Vitals:   01/21/23 1628  BP: 128/88  Pulse: 80  SpO2: 98%   Vitals:   01/21/23 1628  Weight: 248 lb (112.5 kg)  Height: 6' (1.829 m)   Body mass index is 33.63 kg/m.  No results found.   Independent interpretation of notes and tests performed by another provider:   None  Procedures performed:   None  Pertinent History, Exam, Impression, and Recommendations:   Problem List Items Addressed This Visit       Other   Encounter for weight management - Primary    Longstanding issue, has been making efforts with diet and lifestyle changes. He has been on phentermine in the past.  Plan: - Start phentermine to 15 mg daily (take in AM on empty stomach) - Start topiramate in stepwise manner      Relevant Medications   phentermine 15 MG capsule   topiramate (TOPAMAX) 50 MG tablet     Orders & Medications Medications:  Meds ordered this encounter  Medications   phentermine 15 MG capsule    Sig: Take 1 capsule (15 mg total) by mouth every morning.    Dispense:  30 capsule    Refill:  2   topiramate (TOPAMAX) 50 MG tablet    Sig: Take 1 tablet (50 mg total) by mouth daily. One half tab p.o. nightly for 1 week then 1 tab p.o. nightly for 1 week then 1 tab p.o. twice daily    Dispense:  90 tablet    Refill:  0   No orders of the defined types were placed in this encounter.    No follow-ups on file.     [name], MD, [facility]   Primary Care Sports Medicine Primary Care and Sports Medicine at [facility]

## 2023-01-21 NOTE — Assessment & Plan Note (Signed)
Longstanding issue, has been making efforts with diet and lifestyle changes. He has been on phentermine in the past.  Plan: - Start phentermine to 15 mg daily (take in AM on empty stomach) - Start topiramate in stepwise manner

## 2023-02-03 ENCOUNTER — Other Ambulatory Visit: Payer: Self-pay | Admitting: Family Medicine

## 2023-02-03 DIAGNOSIS — I10 Essential (primary) hypertension: Secondary | ICD-10-CM

## 2023-02-04 NOTE — Telephone Encounter (Signed)
Requested Prescriptions  Pending Prescriptions Disp Refills   amLODipine-olmesartan (AZOR) 5-20 MG tablet [Pharmacy Med Name: AMLOD/OLMESA 5-20MG  TABLET] 90 tablet 0    Sig: TAKE 1 TABLET BY MOUTH DAILY     Cardiovascular: CCB + ARB Combos Failed - 02/03/2023  2:45 PM      Failed - K in normal range and within 180 days    Potassium  Date Value Ref Range Status  10/25/2021 3.8 3.5 - 5.1 mmol/L Final         Failed - Cr in normal range and within 180 days    Creatinine, Ser  Date Value Ref Range Status  10/25/2021 0.93 0.61 - 1.24 mg/dL Final         Failed - Na in normal range and within 180 days    Sodium  Date Value Ref Range Status  10/25/2021 136 135 - 145 mmol/L Final         Passed - Patient is not pregnant      Passed - Last BP in normal range    BP Readings from Last 1 Encounters:  01/21/23 128/88         Passed - Valid encounter within last 6 months    Recent Outpatient Visits           2 weeks ago Encounter for weight management   [place] [facility] at [facility], Ocie Bob, MD   8 months ago Primary hypertension   [place] [facility] at [facility], Ocie Bob, MD   1 year ago Primary hypertension   [place] [facility] at [facility], Ocie Bob, MD   1 year ago Annual physical exam   [facility] at [facility], Ocie Bob, MD   1 year ago Primary hypertension   [place] [facility] Medicine at [facility], Ocie Bob, MD       Future Appointments             In 1 month Ashley Royalty, Ocie Bob, MD [facility] Primary Care & Sports Medicine at [facility], [facility]

## 2023-03-02 ENCOUNTER — Other Ambulatory Visit: Payer: Self-pay | Admitting: Family Medicine

## 2023-03-02 DIAGNOSIS — Z7689 Persons encountering health services in other specified circumstances: Secondary | ICD-10-CM

## 2023-03-03 ENCOUNTER — Other Ambulatory Visit: Payer: Self-pay

## 2023-03-03 MED ORDER — TOPIRAMATE 50 MG PO TABS
50.0000 mg | ORAL_TABLET | Freq: Two times a day (BID) | ORAL | 0 refills | Status: DC
Start: 2023-03-03 — End: 2023-03-31

## 2023-03-24 ENCOUNTER — Ambulatory Visit (INDEPENDENT_AMBULATORY_CARE_PROVIDER_SITE_OTHER): Payer: BC Managed Care – PPO | Admitting: Family Medicine

## 2023-03-24 ENCOUNTER — Encounter: Payer: Self-pay | Admitting: Family Medicine

## 2023-03-24 VITALS — BP 118/80 | HR 85 | Ht 72.0 in | Wt 233.0 lb

## 2023-03-24 DIAGNOSIS — Z6831 Body mass index (BMI) 31.0-31.9, adult: Secondary | ICD-10-CM | POA: Diagnosis not present

## 2023-03-24 DIAGNOSIS — Z7689 Persons encountering health services in other specified circumstances: Secondary | ICD-10-CM

## 2023-03-24 DIAGNOSIS — E669 Obesity, unspecified: Secondary | ICD-10-CM | POA: Diagnosis not present

## 2023-03-24 MED ORDER — PHENTERMINE HCL 30 MG PO CAPS: 30.0000 mg | ORAL_CAPSULE | ORAL | 2 refills | Status: DC

## 2023-03-24 NOTE — Assessment & Plan Note (Signed)
History of Present Illness Jared Andrade, a patient on a weight loss regimen, presents with a significant weight loss of fifteen pounds since last visit. He reports adherence to his medication regimen of phentermine and topiramate, with no significant side effects. He has been managing his hunger and maintaining his weight loss through a structured eating and snacking schedule. However, he reports an increase in hunger and is requesting an increase in his phentermine dosage.  Physical Exam VITALS: Blood pressure within normal limits MEASUREMENTS: WT- down fifteen pounds  Assessment & Plan Weight Management Significant weight loss of 15 pounds. Patient reports increased hunger. Currently on Phentermine 15mg  and Topiramate 50mg  twice daily. No reported side effects. -Increase Phentermine to 30mg  daily. -Continue Topiramate 50mg  twice daily. -Check weight in 3 months.  General Health Maintenance -Encouraged to maintain current lifestyle changes including diet and hydration.

## 2023-03-24 NOTE — Patient Instructions (Signed)
YOUR PLAN:  -WEIGHT MANAGEMENT: You have lost 15 pounds, which is a significant achievement. To help manage your increased hunger, we will increase your phentermine dosage to 30mg  daily while continuing your current dose of topiramate at 50mg  twice daily. We will check your weight again in 3 months to monitor your progress.  -GENERAL HEALTH MAINTENANCE: Continue with your current lifestyle changes, including your diet and hydration habits, as they are contributing positively to your weight loss journey.  INSTRUCTIONS:  We will check your weight again in 3 months to monitor your progress.

## 2023-03-24 NOTE — Progress Notes (Signed)
     Primary Care / Sports Medicine Office Visit  Patient Information:  Patient ID: BRODE OBENAUER, male DOB: [birth year] Age: 52 y.o. MRN: [id number]   WARN KAJIWARA II is a pleasant 52 y.o. male presenting with the following:  Chief Complaint  Patient presents with   Weight Check    Patient here for weight check since starting the phentermine.     Vitals:   03/24/23 1533  BP: 118/80  Pulse: 85  SpO2: 96%   Vitals:   03/24/23 1533  Weight: 233 lb (105.7 kg)  Height: 6' (1.829 m)   Body mass index is 31.6 kg/m.  No results found.   Independent interpretation of notes and tests performed by another provider:   None  Procedures performed:   None  Pertinent History, Exam, Impression, and Recommendations:   Problem List Items Addressed This Visit       Other   BMI 31.0-31.9,adult - Primary   Encounter for weight management    History of Present Illness Mr. Wickenhauser, a patient on a weight loss regimen, presents with a significant weight loss of fifteen pounds since last visit. He reports adherence to his medication regimen of phentermine and topiramate, with no significant side effects. He has been managing his hunger and maintaining his weight loss through a structured eating and snacking schedule. However, he reports an increase in hunger and is requesting an increase in his phentermine dosage.  Physical Exam VITALS: Blood pressure within normal limits MEASUREMENTS: WT- down fifteen pounds  Assessment & Plan Weight Management Significant weight loss of 15 pounds. Patient reports increased hunger. Currently on Phentermine 15mg  and Topiramate 50mg  twice daily. No reported side effects. -Increase Phentermine to 30mg  daily. -Continue Topiramate 50mg  twice daily. -Check weight in 3 months.  General Health Maintenance -Encouraged to maintain current lifestyle changes including diet and hydration.        Orders & Medications Medications:  Meds ordered this  encounter  Medications   phentermine 30 MG capsule    Sig: Take 1 capsule (30 mg total) by mouth every morning.    Dispense:  30 capsule    Refill:  2   No orders of the defined types were placed in this encounter.    Return in about 3 months (around 06/22/2023) for weight f/u.     [name], MD, [facility]   Primary Care Sports Medicine Primary Care and Sports Medicine at [facility]

## 2023-03-31 ENCOUNTER — Other Ambulatory Visit: Payer: Self-pay

## 2023-03-31 ENCOUNTER — Other Ambulatory Visit: Payer: Self-pay | Admitting: Family Medicine

## 2023-03-31 DIAGNOSIS — Z7689 Persons encountering health services in other specified circumstances: Secondary | ICD-10-CM

## 2023-03-31 NOTE — Telephone Encounter (Signed)
Please review can pt get 90 day supply?  KP

## 2023-04-01 ENCOUNTER — Other Ambulatory Visit: Payer: Self-pay | Admitting: Family Medicine

## 2023-04-01 DIAGNOSIS — I10 Essential (primary) hypertension: Secondary | ICD-10-CM

## 2023-04-01 NOTE — Telephone Encounter (Signed)
Requested medications are due for refill today.  yes  Requested medications are on the active medications list.  yes  Last refill. 02/04/2023 #90 0 rf  Future visit scheduled.   yes  Notes to clinic.  Labs are expired.    Requested Prescriptions  Pending Prescriptions Disp Refills   amLODipine-olmesartan (AZOR) 5-20 MG tablet [Pharmacy Med Name: AMLOD/OLMESA 5-20MG  TABLET] 90 tablet 3    Sig: TAKE 1 TABLET BY MOUTH DAILY     Cardiovascular: CCB + ARB Combos Failed - 04/01/2023  2:27 PM      Failed - K in normal range and within 180 days    Potassium  Date Value Ref Range Status  10/25/2021 3.8 3.5 - 5.1 mmol/L Final         Failed - Cr in normal range and within 180 days    Creatinine, Ser  Date Value Ref Range Status  10/25/2021 0.93 0.61 - 1.24 mg/dL Final         Failed - Na in normal range and within 180 days    Sodium  Date Value Ref Range Status  10/25/2021 136 135 - 145 mmol/L Final         Passed - Patient is not pregnant      Passed - Last BP in normal range    BP Readings from Last 1 Encounters:  03/24/23 118/80         Passed - Valid encounter within last 6 months    Recent Outpatient Visits           1 week ago BMI 31.0-31.9,adult   [place] [facility] at [facility], Ocie Bob, MD   2 months ago Encounter for weight management   [place] [facility] at [facility], Ocie Bob, MD   10 months ago Primary hypertension   [place] [facility] at [facility], Ocie Bob, MD   1 year ago Primary hypertension   [place] [facility] at [facility], Ocie Bob, MD   1 year ago Annual physical exam   [place] [facility] Primary Care & Sports Medicine at [facility], Ocie Bob, MD       Future Appointments             In 2 months Ashley Royalty, Ocie Bob, MD [facility] Primary Care & Sports Medicine at [facility],  [facility]

## 2023-06-11 ENCOUNTER — Other Ambulatory Visit: Payer: Self-pay | Admitting: Family Medicine

## 2023-06-11 DIAGNOSIS — I10 Essential (primary) hypertension: Secondary | ICD-10-CM

## 2023-06-12 NOTE — Telephone Encounter (Signed)
 Requested medication (s) are due for refill today - yes  Requested medication (s) are on the active medication list -yes  Future visit scheduled -yes  Last refill: 04/01/23 #90  Notes to clinic: fails lab protocol- over 1 year-10/25/21  Requested Prescriptions  Pending Prescriptions Disp Refills   amLODipine-olmesartan (AZOR) 5-20 MG tablet [Pharmacy Med Name: AMLOD/OLMESA 5-20MG  TABLET] 90 tablet 3    Sig: TAKE 1 TABLET BY MOUTH DAILY     Cardiovascular: CCB + ARB Combos Failed - 06/12/2023  3:24 PM      Failed - K in normal range and within 180 days    Potassium  Date Value Ref Range Status  10/25/2021 3.8 3.5 - 5.1 mmol/L Final         Failed - Cr in normal range and within 180 days    Creatinine, Ser  Date Value Ref Range Status  10/25/2021 0.93 0.61 - 1.24 mg/dL Final         Failed - Na in normal range and within 180 days    Sodium  Date Value Ref Range Status  10/25/2021 136 135 - 145 mmol/L Final         Passed - Patient is not pregnant      Passed - Last BP in normal range    BP Readings from Last 1 Encounters:  03/24/23 118/80         Passed - Valid encounter within last 6 months    Recent Outpatient Visits           2 months ago BMI 31.0-31.9,adult   [place] [facility], Ocie Bob, MD   4 months ago Encounter for weight management   [place] [facility], Ocie Bob, MD   1 year ago Primary hypertension   [place] [facility], Ocie Bob, MD   1 year ago Primary hypertension   [place] [facility] at [facility], Ocie Bob, MD   1 year ago Annual physical exam   [place] [facility] at [facility], Ocie Bob, MD       Future Appointments             In 1 week Ashley Royalty, Ocie Bob, MD [facility] at [facility], [facility]               Requested Prescriptions  Pending Prescriptions Disp Refills   amLODipine-olmesartan (AZOR) 5-20 MG tablet [Pharmacy Med Name: AMLOD/OLMESA 5-20MG  TABLET] 90 tablet 3    Sig: TAKE 1 TABLET BY MOUTH DAILY     Cardiovascular: CCB + ARB Combos Failed - 06/12/2023  3:24 PM      Failed - K in normal range and within 180 days    Potassium  Date Value Ref Range Status  10/25/2021 3.8 3.5 - 5.1 mmol/L Final         Failed - Cr in normal range and within 180 days    Creatinine, Ser  Date Value Ref Range Status  10/25/2021 0.93 0.61 - 1.24 mg/dL Final         Failed - Na in normal range and within 180 days    Sodium  Date Value Ref Range Status  10/25/2021 136 135 - 145 mmol/L Final         Passed - Patient  is not pregnant      Passed - Last BP in normal range    BP Readings from Last 1 Encounters:  03/24/23 118/80         Passed - Valid encounter within last 6 months    Recent Outpatient Visits           2 months ago BMI 31.0-31.9,adult   [place] [facility], Ocie Bob, MD   4 months ago Encounter for weight management   [place] [facility] at [facility], Ocie Bob, MD   1 year ago Primary hypertension   [place] [facility] at [facility], Ocie Bob, MD   1 year ago Primary hypertension   [place] [facility] at [facility], Ocie Bob, MD   1 year ago Annual physical exam   [facility] at [facility], Ocie Bob, MD       Future Appointments             In 1 week Ashley Royalty, Ocie Bob, MD [facility] Care & [facility] Medicine at [facility], [facility]

## 2023-06-23 ENCOUNTER — Encounter: Payer: Self-pay | Admitting: Family Medicine

## 2023-06-23 ENCOUNTER — Ambulatory Visit (INDEPENDENT_AMBULATORY_CARE_PROVIDER_SITE_OTHER): Payer: BC Managed Care – PPO | Admitting: Family Medicine

## 2023-06-23 VITALS — BP 104/78 | HR 89 | Ht 72.0 in | Wt 233.0 lb

## 2023-06-23 DIAGNOSIS — R3589 Other polyuria: Secondary | ICD-10-CM | POA: Diagnosis not present

## 2023-06-23 DIAGNOSIS — Z7689 Persons encountering health services in other specified circumstances: Secondary | ICD-10-CM

## 2023-06-23 DIAGNOSIS — Z6831 Body mass index (BMI) 31.0-31.9, adult: Secondary | ICD-10-CM | POA: Diagnosis not present

## 2023-06-23 DIAGNOSIS — R3911 Hesitancy of micturition: Secondary | ICD-10-CM | POA: Diagnosis not present

## 2023-06-23 DIAGNOSIS — N400 Enlarged prostate without lower urinary tract symptoms: Secondary | ICD-10-CM | POA: Insufficient documentation

## 2023-06-23 MED ORDER — BUPROPION HCL ER (XL) 150 MG PO TB24
150.0000 mg | ORAL_TABLET | ORAL | 0 refills | Status: DC
Start: 2023-06-23 — End: 2023-09-25

## 2023-06-23 MED ORDER — PHENTERMINE HCL 37.5 MG PO CAPS
ORAL_CAPSULE | ORAL | 0 refills | Status: DC
Start: 2023-06-23 — End: 2023-07-24

## 2023-06-23 MED ORDER — TAMSULOSIN HCL 0.4 MG PO CAPS
0.4000 mg | ORAL_CAPSULE | Freq: Every day | ORAL | 3 refills | Status: DC
Start: 2023-06-23 — End: 2023-09-02

## 2023-06-23 MED ORDER — TOPIRAMATE 50 MG PO TABS: 50.0000 mg | ORAL_TABLET | Freq: Two times a day (BID) | ORAL | 0 refills | Status: DC

## 2023-06-23 NOTE — Assessment & Plan Note (Signed)
 He experiences frequent urination, particularly at night, which he attributes to soda consumption. He recalls a previous episode where antibiotics were prescribed for similar symptoms. No burning during urination, changes in urine color, or flank pain are present, but he notes a stronger odor and some bladder pressure. Reduced water intake may contribute to darker urine.  When asked further, he endorses urinary hesitancy, chronic concern, we discussed BPH and associated treatments.  Urinary symptoms Concern for BPH, possibly compounded by prostatitis versus UTI. -Order PSA, urinalysis, and urine culture -Rx tamsulosin for daily use over the interim -Patient oriented information provided for review

## 2023-06-23 NOTE — Patient Instructions (Signed)
 Patient Action Plan  Obesity and Weight Management: 1. Increase Phentermine to 37.5 mg daily. 2. Continue taking Topiramate 50 mg twice daily. 3. Start Wellbutrin (Bupropion XL) 150 mg once daily for additional appetite control. 4. Engage in indoor physical activities, such as YouTube cardio workouts, etc.  Urinary Symptoms: 1. Take Tamsulosin 0.4 mg daily to help with urinary symptoms. 2. Complete the ordered PSA test, urinalysis, and urine culture. 3. Review the provided patient information on urinary health and management.  Red Flags: - Contact the office if you experience any new or worsening symptoms, such as severe pain, changes in urine color, or significant changes in urination patterns.

## 2023-06-23 NOTE — Assessment & Plan Note (Signed)
>>  ASSESSMENT AND PLAN FOR BMI 31.0-31.9,ADULT WRITTEN ON 06/23/2023  4:32 PM BY Tobias Avitabile J, MD  He has maintained a stable weight of 233 pounds since the last visit. His eating pattern includes combining breakfast and lunch around 9:00 to 9:30 AM, with no further meals until he returns home. Despite no significant dietary changes, he notes reduced outdoor activity due to weather conditions. He was previously on a low dose of phentermine , which has been increased, and he tolerates it well without issues such as constipation, stomach upset, or sleep disturbances. He is also on topiramate  50 mg twice a day for appetite suppression.  Obesity Stable weight despite increased dose of Phentermine . Discussed potential increase in Phentermine  to 37.5mg  for further appetite suppression and increased caloric burn. Also discussed addition of Wellbutrin  for further appetite suppression via dopamine pathway. Patient has been struggling with physical activity due to weather conditions. -Increase Phentermine  to 37.5mg  daily. -Continue topiramate  50 mg twice daily. -Add Wellbutrin  150 mg extended release for additional appetite suppression. -Encourage indoor physical activity, such as YouTube cardio workouts.

## 2023-06-23 NOTE — Assessment & Plan Note (Signed)
 He has maintained a stable weight of 233 pounds since the last visit. His eating pattern includes combining breakfast and lunch around 9:00 to 9:30 AM, with no further meals until he returns home. Despite no significant dietary changes, he notes reduced outdoor activity due to weather conditions. He was previously on a low dose of phentermine, which has been increased, and he tolerates it well without issues such as constipation, stomach upset, or sleep disturbances. He is also on topiramate 50 mg twice a day for appetite suppression.  Obesity Stable weight despite increased dose of Phentermine. Discussed potential increase in Phentermine to 37.5mg  for further appetite suppression and increased caloric burn. Also discussed addition of Wellbutrin for further appetite suppression via dopamine pathway. Patient has been struggling with physical activity due to weather conditions. -Increase Phentermine to 37.5mg  daily. -Continue topiramate 50 mg twice daily. -Add Wellbutrin 150 mg extended release for additional appetite suppression. -Encourage indoor physical activity, such as YouTube cardio workouts.

## 2023-06-23 NOTE — Progress Notes (Signed)
 Primary Care / Sports Medicine Office Visit  Patient Information:  Patient ID: Jared Andrade, male DOB: 07/20/1970 Age: 53 y.o. MRN: [id number]   Jared Andrade is a pleasant 53 y.o. male presenting with the following:  Chief Complaint  Patient presents with   Weight Check    Patient presents today for a weight check since he has been taking Phentermine and topiramate. He has been taking his medication as directed and it has helped decrease his appetite. He has been walking a lot more and hoping to get more active as the weather gets nicer.     Vitals:   06/23/23 1500  BP: 104/78  Pulse: 89  SpO2: 98%   Vitals:   06/23/23 1500  Weight: 233 lb (105.7 kg)  Height: 6' (1.829 m)   Body mass index is 31.6 kg/m.  No results found.   Independent interpretation of notes and tests performed by another provider:   None  Procedures performed:   None  Pertinent History, Exam, Impression, and Recommendations:   Problem List Items Addressed This Visit     BMI 31.0-31.9,adult   He has maintained a stable weight of 233 pounds since the last visit. His eating pattern includes combining breakfast and lunch around 9:00 to 9:30 AM, with no further meals until he returns home. Despite no significant dietary changes, he notes reduced outdoor activity due to weather conditions. He was previously on a low dose of phentermine, which has been increased, and he tolerates it well without issues such as constipation, stomach upset, or sleep disturbances. He is also on topiramate 50 mg twice a day for appetite suppression.  Obesity Stable weight despite increased dose of Phentermine. Discussed potential increase in Phentermine to 37.5mg  for further appetite suppression and increased caloric burn. Also discussed addition of Wellbutrin for further appetite suppression via dopamine pathway. Patient has been struggling with physical activity due to weather conditions. -Increase Phentermine to  37.5mg  daily. -Continue topiramate 50 mg twice daily. -Add Wellbutrin 150 mg extended release for additional appetite suppression. -Encourage indoor physical activity, such as YouTube cardio workouts.      Relevant Medications   phentermine 37.5 MG capsule   buPROPion (WELLBUTRIN XL) 150 MG 24 hr tablet   Encounter for weight management   Relevant Medications   phentermine 37.5 MG capsule   buPROPion (WELLBUTRIN XL) 150 MG 24 hr tablet   topiramate (TOPAMAX) 50 MG tablet   Polyuria - Primary   He experiences frequent urination, particularly at night, which he attributes to soda consumption. He recalls a previous episode where antibiotics were prescribed for similar symptoms. No burning during urination, changes in urine color, or flank pain are present, but he notes a stronger odor and some bladder pressure. Reduced water intake may contribute to darker urine.  When asked further, he endorses urinary hesitancy, chronic concern, we discussed BPH and associated treatments.  Urinary symptoms Concern for BPH, possibly compounded by prostatitis versus UTI. -Order PSA, urinalysis, and urine culture -Rx tamsulosin for daily use over the interim -Patient oriented information provided for review      Relevant Medications   tamsulosin (FLOMAX) 0.4 MG CAPS capsule   Other Relevant Orders   Urine Culture   Urinalysis   Other Visit Diagnoses       Urinary hesitancy       Relevant Medications   tamsulosin (FLOMAX) 0.4 MG CAPS capsule   Other Relevant Orders   PSA Total (Reflex To Free)  Orders & Medications Medications:  Meds ordered this encounter  Medications   tamsulosin (FLOMAX) 0.4 MG CAPS capsule    Sig: Take 1 capsule (0.4 mg total) by mouth daily after breakfast.    Dispense:  90 capsule    Refill:  3   phentermine 37.5 MG capsule    Sig: One capsule by mouth qAM    Dispense:  30 capsule    Refill:  0   buPROPion (WELLBUTRIN XL) 150 MG 24 hr tablet    Sig: Take  1 tablet (150 mg total) by mouth every morning.    Dispense:  90 tablet    Refill:  0   topiramate (TOPAMAX) 50 MG tablet    Sig: Take 1 tablet (50 mg total) by mouth 2 (two) times daily.    Dispense:  180 tablet    Refill:  0    **Patient requests 90 days supply**   Orders Placed This Encounter  Procedures   Urine Culture   Urinalysis   PSA Total (Reflex To Free)     No follow-ups on file.     [name], MD, [facility]   Primary Care Sports Medicine Primary Care and Sports Medicine at [facility]

## 2023-06-24 ENCOUNTER — Other Ambulatory Visit
Admission: RE | Admit: 2023-06-24 | Discharge: 2023-06-24 | Disposition: A | Discharge status: Home / Self Care | Attending: Family Medicine | Admitting: Family Medicine

## 2023-06-24 ENCOUNTER — Encounter: Payer: Self-pay | Admitting: Family Medicine

## 2023-06-24 ENCOUNTER — Other Ambulatory Visit: Payer: Self-pay | Admitting: Family Medicine

## 2023-06-24 DIAGNOSIS — R8271 Bacteriuria: Secondary | ICD-10-CM

## 2023-06-24 DIAGNOSIS — R3911 Hesitancy of micturition: Secondary | ICD-10-CM | POA: Insufficient documentation

## 2023-06-24 DIAGNOSIS — R3589 Other polyuria: Secondary | ICD-10-CM | POA: Insufficient documentation

## 2023-06-24 LAB — URINALYSIS, COMPLETE (UACMP) WITH MICROSCOPIC
Bilirubin Urine: NEGATIVE
Glucose, UA: NEGATIVE mg/dL
Hgb urine dipstick: NEGATIVE
Ketones, ur: NEGATIVE mg/dL
Leukocytes,Ua: NEGATIVE
Nitrite: NEGATIVE
Protein, ur: NEGATIVE mg/dL
Specific Gravity, Urine: 1.01 (ref 1.005–1.030)
pH: 6 (ref 5.0–8.0)

## 2023-06-24 MED ORDER — CIPROFLOXACIN HCL 500 MG PO TABS
500.0000 mg | ORAL_TABLET | Freq: Two times a day (BID) | ORAL | 0 refills | Status: AC
Start: 2023-06-24 — End: 2023-06-29

## 2023-06-25 ENCOUNTER — Encounter: Payer: Self-pay | Admitting: Family Medicine

## 2023-06-25 LAB — URINE CULTURE: Culture: NO GROWTH

## 2023-06-25 LAB — PSA, TOTAL AND FREE
PSA, Free Pct: 50 %
PSA, Free: 0.1 ng/mL
Prostate Specific Ag, Serum: 0.2 ng/mL (ref 0.0–4.0)

## 2023-07-22 ENCOUNTER — Other Ambulatory Visit: Payer: Self-pay | Admitting: Family Medicine

## 2023-07-22 DIAGNOSIS — Z7689 Persons encountering health services in other specified circumstances: Secondary | ICD-10-CM

## 2023-07-22 DIAGNOSIS — Z6831 Body mass index (BMI) 31.0-31.9, adult: Secondary | ICD-10-CM

## 2023-07-23 NOTE — Telephone Encounter (Signed)
 Please review.  KP

## 2023-07-23 NOTE — Telephone Encounter (Signed)
 Requested medication (s) are due for refill today: Yes  Requested medication (s) are on the active medication list: Yes  Last refill:  06/23/23  Future visit scheduled: No  Notes to clinic:  Unable to refill per protocol, cannot delegate.      Requested Prescriptions  Pending Prescriptions Disp Refills   phentermine 37.5 MG capsule [Pharmacy Med Name: PHENTERMINE 37.5MG  CAPSULES] 30 capsule     Sig: TAKE 1 CAPSULE BY MOUTH EVERY MORNING     Not Delegated - Neurology: Anticonvulsants - Controlled - phentermine hydrochloride Failed - 07/23/2023  2:44 PM      Failed - This refill cannot be delegated      Failed - eGFR in normal range and within 360 days    GFR, Estimated  Date Value Ref Range Status  10/25/2021 >60 >60 mL/min Final    Comment:    (NOTE) Calculated using the CKD-EPI Creatinine Equation (2021)          Failed - Cr in normal range and within 360 days    Creatinine, Ser  Date Value Ref Range Status  10/25/2021 0.93 0.61 - 1.24 mg/dL Final         Passed - Last BP in normal range    BP Readings from Last 1 Encounters:  06/23/23 104/78         Passed - Valid encounter within last 6 months    Recent Outpatient Visits           1 month ago Polyuria    [facility] Care & [facility] Medicine at [facility], Ocie Bob, MD              Passed - Weight completed in the last 3 months    Wt Readings from Last 1 Encounters:  06/23/23 233 lb (105.7 kg)

## 2023-07-24 NOTE — Telephone Encounter (Signed)
 Please call pt to schedule an appt.  KP

## 2023-09-02 ENCOUNTER — Telehealth: Payer: Self-pay | Admitting: Pharmacy Technician

## 2023-09-02 ENCOUNTER — Other Ambulatory Visit (HOSPITAL_COMMUNITY): Payer: Self-pay

## 2023-09-02 ENCOUNTER — Other Ambulatory Visit: Payer: Self-pay

## 2023-09-02 DIAGNOSIS — R3589 Other polyuria: Secondary | ICD-10-CM

## 2023-09-02 DIAGNOSIS — R3911 Hesitancy of micturition: Secondary | ICD-10-CM

## 2023-09-02 MED ORDER — TAMSULOSIN HCL 0.4 MG PO CAPS
0.4000 mg | ORAL_CAPSULE | Freq: Every day | ORAL | 3 refills | Status: DC
Start: 2023-09-02 — End: 2023-12-22

## 2023-09-02 NOTE — Telephone Encounter (Signed)
 Pharmacy Patient Advocate Encounter   Received notification from Onbase that prior authorization for TAMSULOSIN  0.4 MG CAPS is required/requested.   Insurance verification completed.   The patient is insured through [facility] .   Per test claim: PA is not needed, pt's plan has a mandatory mail order fill after maximum of 2 fills at a retail pharmacy.  Since he as exhausted his 2 fills he must use mail order pharmacy though his plan

## 2023-09-02 NOTE — Telephone Encounter (Signed)
 Completed. JM

## 2023-09-08 IMAGING — CR DG KNEE COMPLETE 4+V*L*
4 series · 4 of 4 positions shown · non-contrast
Comparison: None.

CLINICAL DATA: Chronic anterior pain status post multiple
arthroscopies. Left knee pain with walking.

EXAM:
LEFT KNEE - COMPLETE 4+ VIEW

[knee ap]
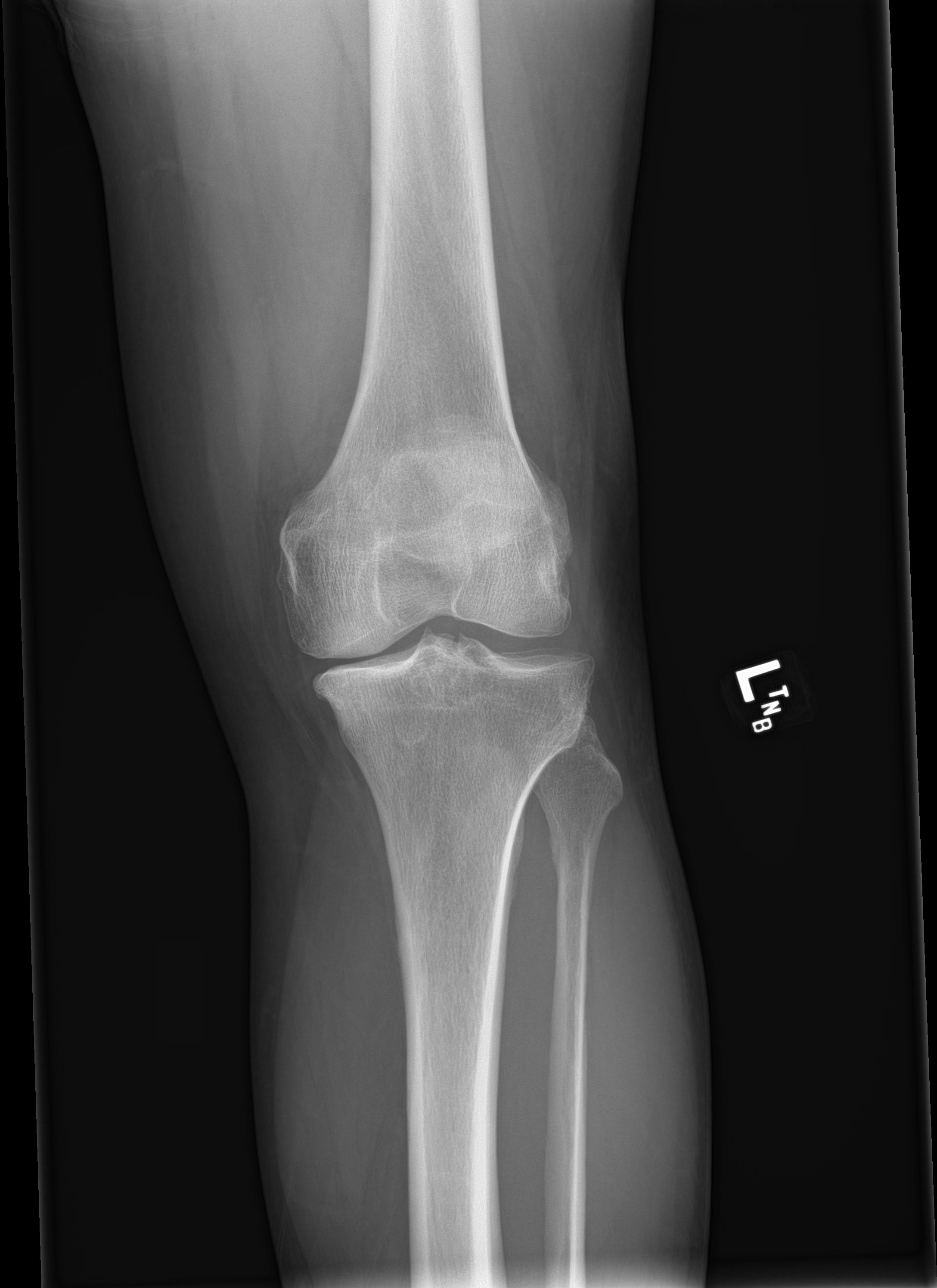

[knee lat]
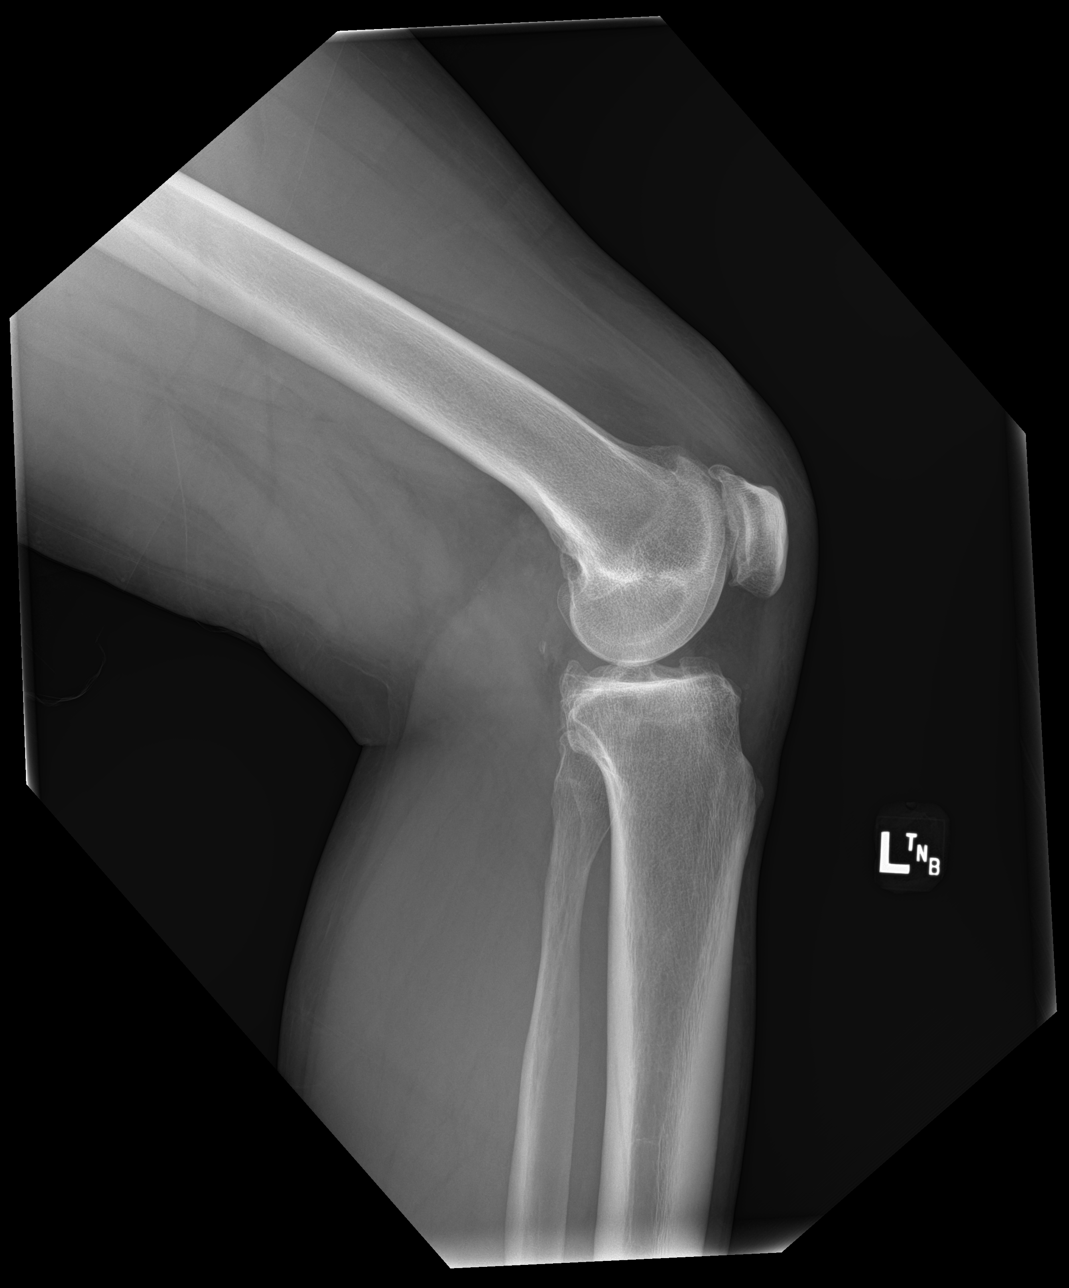

[knee obl (1 of 2)]
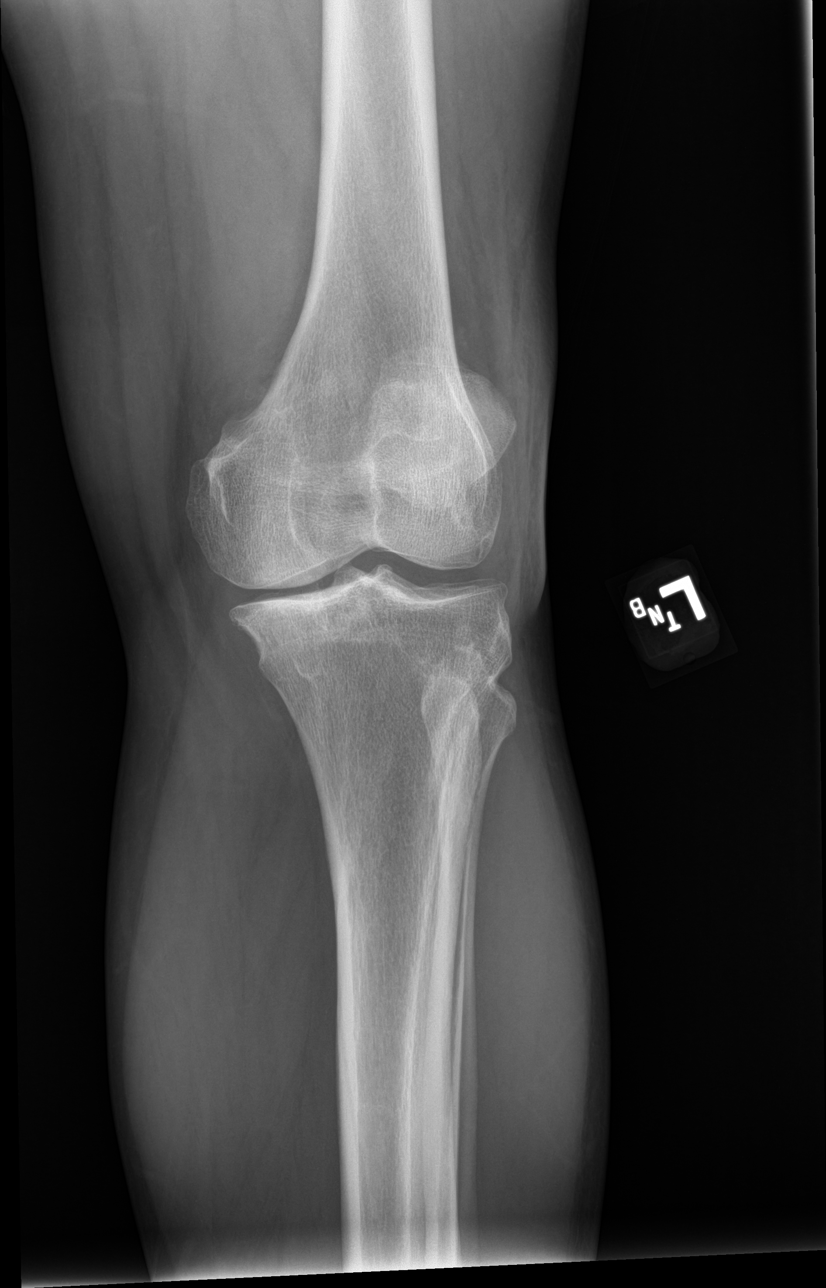

[knee obl (2 of 2)]
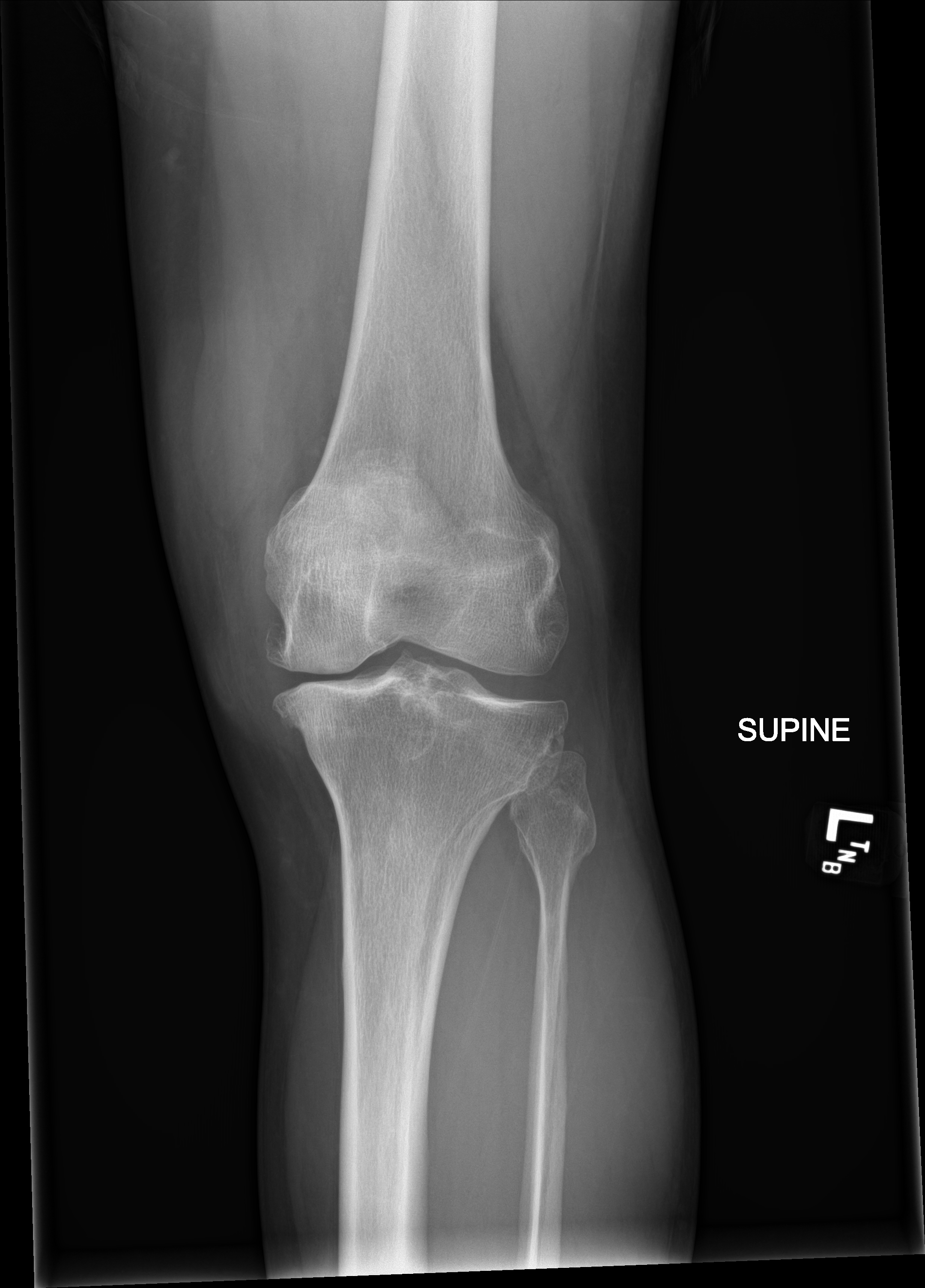

[4 of 4 positions shown; findings below may reference images not displayed]

FINDINGS: Moderate medial compartment joint space narrowing and peripheral
osteophytosis. Severe patellofemoral joint space narrowing with
moderate superior and mild inferior patellar degenerative
osteophytes. No joint effusion. No acute fracture or dislocation.
IMPRESSION: Severe patellofemoral and moderate medial compartment
osteoarthritis.

## 2023-09-20 ENCOUNTER — Other Ambulatory Visit: Payer: Self-pay | Admitting: Family Medicine

## 2023-09-20 DIAGNOSIS — Z7689 Persons encountering health services in other specified circumstances: Secondary | ICD-10-CM

## 2023-09-22 NOTE — Telephone Encounter (Signed)
 Requested medications are due for refill today.  yes  Requested medications are on the active medications list.  yes  Last refill. 06/23/2023 #180 0 rf  Future visit scheduled.   no  Notes to clinic.  Labs are expired.    Requested Prescriptions  Pending Prescriptions Disp Refills   topiramate  (TOPAMAX ) 50 MG tablet [Pharmacy Med Name: TOPIRAMATE  50MG  TABLETS] 180 tablet 0    Sig: TAKE 1 TABLET(50 MG) BY MOUTH TWICE DAILY     Neurology: Anticonvulsants - topiramate  & zonisamide Failed - 09/22/2023  2:54 PM      Failed - Cr in normal range and within 360 days    Creatinine, Ser  Date Value Ref Range Status  10/25/2021 0.93 0.61 - 1.24 mg/dL Final         Failed - CO2 in normal range and within 360 days    CO2  Date Value Ref Range Status  10/25/2021 22 22 - 32 mmol/L Final         Failed - ALT in normal range and within 360 days    ALT  Date Value Ref Range Status  10/25/2021 40 0 - 44 U/L Final         Failed - AST in normal range and within 360 days    AST  Date Value Ref Range Status  10/25/2021 32 15 - 41 U/L Final         Passed - Completed PHQ-2 or PHQ-9 in the last 360 days      Passed - Valid encounter within last 12 months    Recent Outpatient Visits           3 months ago Polyuria   [facility] Medicine at [facility], Jason J, MD

## 2023-09-24 ENCOUNTER — Other Ambulatory Visit: Payer: Self-pay | Admitting: Family Medicine

## 2023-09-24 DIAGNOSIS — Z7689 Persons encountering health services in other specified circumstances: Secondary | ICD-10-CM

## 2023-09-24 DIAGNOSIS — Z6831 Body mass index (BMI) 31.0-31.9, adult: Secondary | ICD-10-CM

## 2023-09-25 ENCOUNTER — Other Ambulatory Visit: Payer: Self-pay

## 2023-09-25 NOTE — Telephone Encounter (Signed)
 Requested medications are due for refill today.  yes  Requested medications are on the active medications list.  yes  Last refill. 06/23/2023 #90 0 rf  Future visit scheduled.   no  Notes to clinic.  Labs are expired.    Requested Prescriptions  Pending Prescriptions Disp Refills   buPROPion  (WELLBUTRIN  XL) 150 MG 24 hr tablet [Pharmacy Med Name: BUPROPION  XL 150MG  TABLETS (24 H)] 90 tablet 0    Sig: TAKE 1 TABLET(150 MG) BY MOUTH EVERY MORNING     Psychiatry: Antidepressants - bupropion  Failed - 09/25/2023  1:28 PM      Failed - Cr in normal range and within 360 days    Creatinine, Ser  Date Value Ref Range Status  10/25/2021 0.93 0.61 - 1.24 mg/dL Final         Failed - AST in normal range and within 360 days    AST  Date Value Ref Range Status  10/25/2021 32 15 - 41 U/L Final         Failed - ALT in normal range and within 360 days    ALT  Date Value Ref Range Status  10/25/2021 40 0 - 44 U/L Final         Passed - Last BP in normal range    BP Readings from Last 1 Encounters:  06/23/23 104/78         Passed - Valid encounter within last 6 months    Recent Outpatient Visits           3 months ago Polyuria   [facility] Medicine at [facility], Jason J, MD

## 2023-11-17 ENCOUNTER — Ambulatory Visit (INDEPENDENT_AMBULATORY_CARE_PROVIDER_SITE_OTHER): Admitting: Family Medicine

## 2023-11-17 ENCOUNTER — Encounter: Payer: Self-pay | Admitting: Family Medicine

## 2023-11-17 VITALS — BP 120/86 | HR 85 | Ht 72.0 in | Wt 242.0 lb

## 2023-11-17 DIAGNOSIS — E6609 Other obesity due to excess calories: Secondary | ICD-10-CM | POA: Diagnosis not present

## 2023-11-17 MED ORDER — PHENTERMINE HCL 37.5 MG PO TABS
37.5000 mg | ORAL_TABLET | Freq: Every day | ORAL | 0 refills | Status: DC
Start: 2023-11-17 — End: 2023-12-17

## 2023-11-17 NOTE — Progress Notes (Unsigned)
     Primary Care / Sports Medicine Office Visit  Patient Information:  Patient ID: Jared Andrade, male DOB: 21-Sep-1970 Age: 53 y.o. MRN: [id number]   Jared Andrade is a pleasant 53 y.o. male presenting with the following:  Chief Complaint  Patient presents with   Weight Management Screening    Patient presents today for weight management. He has taken topamax  and phentermine  in the past which helped and he is wanting to start this again.     Vitals:   11/17/23 1449  BP: 120/86  Pulse: 85  SpO2: 94%   Vitals:   11/17/23 1449  Weight: 242 lb (109.8 kg)  Height: 6' (1.829 m)   Body mass index is 32.82 kg/m.  No results found.   Independent interpretation of notes and tests performed by another provider:   None  Procedures performed:   None  Pertinent History, Exam, Impression, and Recommendations:   Problem List Items Addressed This Visit     Obesity - Primary   History of Present Illness Jared Andrade is a 53 year old male who presents for weight management.  Weight gain and weight management - Current weight is 240-242 pounds, increased from 204 pounds in May 2021 - Initial weight loss in 2021 attributed to phentermine  use, increased physical activity, and dietary changes - Motivated to lose weight for son's wedding in [place] in June - Plans to resume playing basketball one to two days per week with church league friends  Pharmacologic weight loss therapy - Previously prescribed phentermine  in 2021, found effective, especially the blue tablets with spots  Dietary habits - Currently eats two meals per day - Lunch typically consists of sandwiches, chips, and soda - Dinner varies - Occasionally consumes chocolate cake and ice cream - Low intake of fruits and vegetables, plans to increase especially at lunch  Exercise and physical activity - Previously engaged in regular walking and played basketball in a church league during initial weight loss  period - Recently reconnected with church league friends and plans to resume basketball one to two days per week      Relevant Medications   phentermine  (ADIPEX-P ) 37.5 MG tablet     Orders & Medications Medications:  Meds ordered this encounter  Medications   phentermine  (ADIPEX-P ) 37.5 MG tablet    Sig: Take 1 tablet (37.5 mg total) by mouth daily before breakfast.    Dispense:  30 tablet    Refill:  0   No orders of the defined types were placed in this encounter.    Return in about 3 months (around 02/17/2024) for Weight MGMT f/u.     Selinda JINNY Ku, MD, [facility]   Primary Care Sports Medicine Primary Care and Sports Medicine at [facility]

## 2023-11-18 NOTE — Assessment & Plan Note (Addendum)
 History of Present Illness Jared Andrade is a 53 year old male who presents for weight management.  Weight gain and weight management - Current weight is 240-242 pounds, increased from 204 pounds in May 2021 - Initial weight loss in 2021 attributed to phentermine  use, increased physical activity, and dietary changes - Motivated to lose weight for son's wedding in [place] in June - Plans to resume playing basketball one to two days per week with church league friends  Pharmacologic weight loss therapy - Previously prescribed phentermine  in 2021, found effective, especially the blue tablets with spots  Dietary habits - Currently eats two meals per day - Lunch typically consists of sandwiches, chips, and soda - Dinner varies - Occasionally consumes chocolate cake and ice cream - Low intake of fruits and vegetables, plans to increase especially at lunch  Exercise and physical activity - Previously engaged in regular walking and played basketball in a church league during initial weight loss period - Recently reconnected with church league friends and plans to resume basketball one to two days per week  Lifestyle modifications were reviewed and reinforced at today's visit. This includes:  - Recent weight check: Most recent weight documented within the last 45 days:  Filed Weights   11/17/23 1449  Weight: 242 lb (109.8 kg)   Weight trends are being monitored to assess response to current interventions.  - Dietary recommendations: Patient counseled on adopting a nutritionally balanced, calorie-appropriate diet focused on whole foods. Recommendations include increasing intake of vegetables, fruits, lean proteins, and whole grains, while reducing consumption of added sugars, refined carbohydrates, saturated fats, and highly processed foods. Portion control and mindful eating habits were discussed. Patient encouraged to make gradual, sustainable dietary changes in alignment with health  goals. Current dietary focus is: Eating more fruits and vegetables at lunch  - Exercise plan: Patient was counseled on the benefits of regular physical activity and encouraged to engage in a sustainable and enjoyable exercise routine. Options discussed include aerobic activities (e.g., walking, swimming, cycling), resistance training, and flexibility or balance exercises. Goal is to gradually increase physical activity toward at least 150 minutes per week of moderate-intensity activity, as tolerated. Patient will choose preferred form(s) of exercise that align with individual capabilities, interests, and schedule. Plan to reassess activity level and adjust as needed. Current exercise plan is: Increasing basketball with church team  - Behavioral reinforcement: Emphasis was placed on the ongoing importance of lifestyle modifications in conjunction with pharmacologic therapy. Patient expressed willingness to continue with dietary, physical activity, and behavioral strategies. Barriers patient is working on: Time (working on when to meet with friends to basketball)  Plan to reassess weight, dietary adherence, physical activity, and healthy behaviors at follow-up.   Obesity Obesity with current weight of 242 lbs. Previous weight loss achieved with phentermine  and increased physical activity. Discussed phentermine  as adjunct to lifestyle modifications, emphasizing combination with dietary changes and exercise. Discussed potential side effects of phentermine  and alternative injectable medications. - Prescribe phentermine  37.5 mg once daily on empty stomach, has tolerated this dose and all interim doses without issues, prefers to initiate at this. - Monitor for phentermine  side effects.  Contact us  if they occur, we can send in lower dose. - Follow-up in three months for weight check.

## 2023-11-18 NOTE — Patient Instructions (Addendum)
 Patient Plan for Post-Visit Guidance  Obesity and Weight Management  - Start phentermine  to 37.5 mg daily. - Continue taking topiramate  50 mg twice daily. - Continue Wellbutrin  150 mg extended release daily. - Maintain regular physical activity, including walking and basketball.  Red Flags: - If you experience chest pain, shortness of breath, severe headache, mood changes, or any new or worsening symptoms, contact the office immediately.

## 2023-12-15 ENCOUNTER — Encounter: Payer: Self-pay | Admitting: Family Medicine

## 2023-12-16 NOTE — Telephone Encounter (Signed)
 Please send Phentermine  in for patient.  JM

## 2023-12-17 ENCOUNTER — Other Ambulatory Visit: Payer: Self-pay | Admitting: Family Medicine

## 2023-12-17 DIAGNOSIS — E6609 Other obesity due to excess calories: Secondary | ICD-10-CM

## 2023-12-17 MED ORDER — PHENTERMINE HCL 37.5 MG PO TABS
37.5000 mg | ORAL_TABLET | Freq: Every day | ORAL | 1 refills | Status: DC
Start: 2023-12-17 — End: 2024-02-17

## 2023-12-17 NOTE — Telephone Encounter (Signed)
 Please review.  KP

## 2023-12-22 ENCOUNTER — Encounter: Payer: Self-pay | Admitting: Family Medicine

## 2023-12-22 ENCOUNTER — Ambulatory Visit (INDEPENDENT_AMBULATORY_CARE_PROVIDER_SITE_OTHER): Admitting: Family Medicine

## 2023-12-22 VITALS — BP 118/88 | HR 89 | Ht 72.0 in | Wt 232.0 lb

## 2023-12-22 DIAGNOSIS — R3589 Other polyuria: Secondary | ICD-10-CM | POA: Diagnosis not present

## 2023-12-22 DIAGNOSIS — N401 Enlarged prostate with lower urinary tract symptoms: Secondary | ICD-10-CM | POA: Diagnosis not present

## 2023-12-22 DIAGNOSIS — R3911 Hesitancy of micturition: Secondary | ICD-10-CM | POA: Diagnosis not present

## 2023-12-22 DIAGNOSIS — R350 Frequency of micturition: Secondary | ICD-10-CM | POA: Diagnosis not present

## 2023-12-22 LAB — POCT URINALYSIS DIPSTICK
Bilirubin, UA: NEGATIVE
Blood, UA: NEGATIVE
Glucose, UA: NEGATIVE
Ketones, UA: NEGATIVE
Leukocytes, UA: NEGATIVE
Nitrite, UA: NEGATIVE
Protein, UA: NEGATIVE
Spec Grav, UA: 1.02 (ref 1.010–1.025)
Urobilinogen, UA: 0.2 U/dL
pH, UA: 6 (ref 5.0–8.0)

## 2023-12-22 MED ORDER — TAMSULOSIN HCL 0.4 MG PO CAPS
0.8000 mg | ORAL_CAPSULE | Freq: Every day | ORAL | 0 refills | Status: AC
Start: 2023-12-22 — End: ?

## 2023-12-22 NOTE — Progress Notes (Signed)
 Primary Care / Sports Medicine Office Visit  Patient Information:  Patient ID: Jared Andrade, male DOB: 1970/11/02 Age: 53 y.o. MRN: [id number]   Jared Andrade is a pleasant 53 y.o. male presenting with the following:  Chief Complaint  Patient presents with   Urinary Frequency    Urinary frequency since last night.     Vitals:   12/22/23 1440  BP: 118/88  Pulse: 89  SpO2: 97%   Vitals:   12/22/23 1440  Weight: 232 lb (105.2 kg)  Height: 6' (1.829 m)   Body mass index is 31.46 kg/m.  No results found.   Discussed the use of AI scribe software for clinical note transcription with the patient, who gave verbal consent to proceed.   Independent interpretation of notes and tests performed by another provider:   None  Procedures performed:   None  Pertinent History, Exam, Impression, and Recommendations:   Problem List Items Addressed This Visit     BPH (benign prostatic hyperplasia)   History of Present Illness Jared Andrade is a 53 year old male with benign prostatic hyperplasia who presents with increased nocturia and urinary frequency.  Lower urinary tract symptoms - Increased nocturia and urinary frequency over the past few days - Nocturia occurs every two hours at night, causing significant sleep disruption - Frequent urge to urinate shortly after voiding, described as aggravating - Urine output is less than usual but not minimal - Often has to wait to initiate urination - No overt pain with urination - No changes in urine odor - No flank pain - No nausea  Urine characteristics - Darker urine today compared to previous days - Urine remains yellow despite drinking water  throughout the day  Medication use and response - Currently taking Flomax  0.4 mg for benign prostatic hyperplasia - Flomax  had previously improved urinary symptoms  Dietary and lifestyle factors - Recent increase in soda consumption during a social gathering, attributed  to symptom flare-up - Actively managing weight with a loss of eleven pounds over the past month - Walking approximately 14,000 steps per day during the workweek - Maintaining a diet of two meals per day  Pharmacologic weight management - Currently using phentermine  for weight management - No side effects from phentermine   Physical Exam MEASUREMENTS: Weight- 232.  Assessment and Plan Benign prostatic hyperplasia with lower urinary tract symptoms Recurrent lower urinary tract symptoms suggestive of BPH with recent exacerbation. Previous improvement with tamsulosin  noted, but symptoms have returned. Differential includes BPH and possible low-grade infection. Urine culture pending to rule out infection. - Increase tamsulosin  to 0.8 mg daily. Use two 0.4 mg tablets. - Send urine for culture. Initiate antibiotics if culture positive. - Advise regular fluid intake; avoid excessive fluids, caffeine, and sugar. - Discuss lifestyle modifications (see separate MyChart message). - Consider urology referral if symptoms persist or worsen.      Relevant Medications   tamsulosin  (FLOMAX ) 0.4 MG CAPS capsule   Other Visit Diagnoses       Urinary frequency    -  Primary   Relevant Orders   POCT urinalysis dipstick (Completed)   Urine Culture     Urinary hesitancy       Relevant Medications   tamsulosin  (FLOMAX ) 0.4 MG CAPS capsule        Orders & Medications Medications:  Meds ordered this encounter  Medications   tamsulosin  (FLOMAX ) 0.4 MG CAPS capsule    Sig: Take 2 capsules (0.8  mg total) by mouth daily after breakfast.    Dispense:  180 capsule    Refill:  0   Orders Placed This Encounter  Procedures   Urine Culture   POCT urinalysis dipstick     No follow-ups on file.     Selinda JINNY Ku, MD, [facility]   Primary Care Sports Medicine Primary Care and Sports Medicine at [facility]

## 2023-12-22 NOTE — Patient Instructions (Signed)
 Benign Prostatic Hyperplasia (BPH) Care  - Increase tamsulosin  to 0.8 mg daily (take two 0.4 mg tablets each day). - Urine sample sent for culture; start antibiotics only if the culture is positive. We will contact you if that is the case. - Drink fluids regularly, but avoid excessive fluids, caffeine, and sugar. - Review lifestyle modification tips sent via MyChart. - Consider referral to urology if symptoms do not improve or get worse.  When to Seek Immediate Medical Attention  - Inability to urinate - Severe lower abdominal pain - Blood in urine - Fever or chills  Continue to monitor your symptoms and follow up as directed.

## 2023-12-22 NOTE — Assessment & Plan Note (Signed)
 History of Present Illness Jared Andrade is a 53 year old male with benign prostatic hyperplasia who presents with increased nocturia and urinary frequency.  Lower urinary tract symptoms - Increased nocturia and urinary frequency over the past few days - Nocturia occurs every two hours at night, causing significant sleep disruption - Frequent urge to urinate shortly after voiding, described as aggravating - Urine output is less than usual but not minimal - Often has to wait to initiate urination - No overt pain with urination - No changes in urine odor - No flank pain - No nausea  Urine characteristics - Darker urine today compared to previous days - Urine remains yellow despite drinking water  throughout the day  Medication use and response - Currently taking Flomax  0.4 mg for benign prostatic hyperplasia - Flomax  had previously improved urinary symptoms  Dietary and lifestyle factors - Recent increase in soda consumption during a social gathering, attributed to symptom flare-up - Actively managing weight with a loss of eleven pounds over the past month - Walking approximately 14,000 steps per day during the workweek - Maintaining a diet of two meals per day  Pharmacologic weight management - Currently using phentermine  for weight management - No side effects from phentermine   Physical Exam MEASUREMENTS: Weight- 232.  Assessment and Plan Benign prostatic hyperplasia with lower urinary tract symptoms Recurrent lower urinary tract symptoms suggestive of BPH with recent exacerbation. Previous improvement with tamsulosin  noted, but symptoms have returned. Differential includes BPH and possible low-grade infection. Urine culture pending to rule out infection. - Increase tamsulosin  to 0.8 mg daily. Use two 0.4 mg tablets. - Send urine for culture. Initiate antibiotics if culture positive. - Advise regular fluid intake; avoid excessive fluids, caffeine, and sugar. - Discuss  lifestyle modifications (see separate MyChart message). - Consider urology referral if symptoms persist or worsen.

## 2023-12-25 ENCOUNTER — Ambulatory Visit: Payer: Self-pay | Admitting: Family Medicine

## 2023-12-25 LAB — URINE CULTURE

## 2023-12-25 LAB — SPECIMEN STATUS REPORT

## 2024-02-17 ENCOUNTER — Ambulatory Visit (INDEPENDENT_AMBULATORY_CARE_PROVIDER_SITE_OTHER): Admitting: Family Medicine

## 2024-02-17 ENCOUNTER — Encounter: Payer: Self-pay | Admitting: Family Medicine

## 2024-02-17 VITALS — BP 118/82 | HR 82 | Ht 72.0 in | Wt 232.0 lb

## 2024-02-17 DIAGNOSIS — Z6831 Body mass index (BMI) 31.0-31.9, adult: Secondary | ICD-10-CM

## 2024-02-17 DIAGNOSIS — I10 Essential (primary) hypertension: Secondary | ICD-10-CM | POA: Diagnosis not present

## 2024-02-17 DIAGNOSIS — E6609 Other obesity due to excess calories: Secondary | ICD-10-CM | POA: Diagnosis not present

## 2024-02-17 MED ORDER — PHENTERMINE HCL 37.5 MG PO TABS
37.5000 mg | ORAL_TABLET | Freq: Every day | ORAL | 2 refills | Status: AC
Start: 2024-02-17 — End: ?

## 2024-02-17 NOTE — Progress Notes (Signed)
     Primary Care / Sports Medicine Office Visit  Patient Information:  Patient ID: Jared Andrade, male DOB: May 02, 1970 Age: 53 y.o. MRN: [id number]   Jared Andrade is a pleasant 53 y.o. male presenting with the following:  Chief Complaint  Patient presents with   Weight Management Screening    Patient presents today for weight check. He has been taking phentermine  and Topamax  for weight loss. He has been doing well with no side effects.    Vitals:   02/17/24 1441  BP: 118/82  Pulse: 82  SpO2: 95%   Vitals:   02/17/24 1441  Weight: 232 lb (105.2 kg)  Height: 6' (1.829 m)   Body mass index is 31.46 kg/m.  No results found.   Discussed the use of AI scribe software for clinical note transcription with the patient, who gave verbal consent to proceed.   Independent interpretation of notes and tests performed by another provider:   None  Procedures performed:   None  Pertinent History, Exam, Impression, and Recommendations:   Problem List Items Addressed This Visit     BMI 31.0-31.9,adult - Primary   History of Present Illness Jared Andrade is a 53 year old male who presents for weight management and medication review.  Weight management and energy levels - Current weight ranges from 226 to 232 pounds, with higher weights including steel-toed shoes and items in pockets - Home scale shows a downward trend in weight - Increased energy and improved sense of well-being - Clothes fit better - Increased physical activity, including frequent walking at work  Pharmacologic weight management - Currently taking phentermine  for weight management with good efficacy - Previously trialed Wellbutrin , discontinued due to dizziness when combined with phentermine  - Not taking topiramate   Physical activity and fatigue - Walks frequently at work, contributing to overall physical activity - Experiences exhaustion by the time he gets home  Bowel and sleep patterns -  Regular bowel movements without issues - Adequate sleep without disturbances  Assessment and Plan Obesity Phentermine  effective for weight loss and energy. Bupropion  discontinued due to dizziness. Emphasized lifestyle modifications. - Continue phentermine  for weight management. - Encouraged lifestyle modifications, including diet and exercise. - Advised on muscle-strengthening exercises to prevent muscle loss. - Sent prescription for three months of phentermine .        Obesity   Relevant Medications   phentermine  (ADIPEX-P ) 37.5 MG tablet   Primary hypertension   Hypertension Blood pressure well-controlled with olmesartan  and amlodipine . Home readings stable. - Continue olmesartan  and amlodipine  for blood pressure management.        Orders & Medications Medications:  Meds ordered this encounter  Medications   phentermine  (ADIPEX-P ) 37.5 MG tablet    Sig: Take 1 tablet (37.5 mg total) by mouth daily before breakfast.    Dispense:  30 tablet    Refill:  2   No orders of the defined types were placed in this encounter.    No follow-ups on file.     Jared JINNY Ku, MD, [facility]   Primary Care Sports Medicine Primary Care and Sports Medicine at [facility]

## 2024-02-17 NOTE — Assessment & Plan Note (Signed)
 Hypertension Blood pressure well-controlled with olmesartan  and amlodipine . Home readings stable. - Continue olmesartan  and amlodipine  for blood pressure management.

## 2024-02-17 NOTE — Patient Instructions (Signed)
 VISIT SUMMARY:  Today, we reviewed your weight management progress and medication regimen. You have shown a downward trend in weight and increased energy levels. We also discussed your blood pressure and management of benign prostatic hyperplasia.  YOUR PLAN:  WEIGHT: You are currently taking phentermine  for weight management, which has been effective. You have also made positive lifestyle changes. -Continue taking phentermine  as prescribed for weight management. -Maintain lifestyle modifications, including a healthy diet and regular exercise. -Incorporate muscle-strengthening exercises to prevent muscle loss. -A prescription for three months of phentermine  has been sent.  ESSENTIAL HYPERTENSION: Your blood pressure is well-controlled with your current medications. -Continue taking olmesartan  and amlodipine  as prescribed for blood pressure management.

## 2024-02-17 NOTE — Assessment & Plan Note (Signed)
 History of Present Illness Jared Andrade is a 53 year old male who presents for weight management and medication review.  Weight management and energy levels - Current weight ranges from 226 to 232 pounds, with higher weights including steel-toed shoes and items in pockets - Home scale shows a downward trend in weight - Increased energy and improved sense of well-being - Clothes fit better - Increased physical activity, including frequent walking at work  Pharmacologic weight management - Currently taking phentermine  for weight management with good efficacy - Previously trialed Wellbutrin , discontinued due to dizziness when combined with phentermine  - Not taking topiramate   Physical activity and fatigue - Walks frequently at work, contributing to overall physical activity - Experiences exhaustion by the time he gets home  Bowel and sleep patterns - Regular bowel movements without issues - Adequate sleep without disturbances  Assessment and Plan Obesity Phentermine  effective for weight loss and energy. Bupropion  discontinued due to dizziness. Emphasized lifestyle modifications. - Continue phentermine  for weight management. - Encouraged lifestyle modifications, including diet and exercise. - Advised on muscle-strengthening exercises to prevent muscle loss. - Sent prescription for three months of phentermine .

## 2024-04-20 ENCOUNTER — Other Ambulatory Visit: Payer: Self-pay | Admitting: Family Medicine

## 2024-04-20 DIAGNOSIS — I10 Essential (primary) hypertension: Secondary | ICD-10-CM

## 2024-04-22 NOTE — Telephone Encounter (Signed)
 Rx 06/12/23 #90 3RF- 1 year Rx- too soon Requested Prescriptions  Pending Prescriptions Disp Refills   amLODipine -olmesartan  (AZOR ) 5-20 MG tablet [Pharmacy Med Name: AMLOD/OLMESA 5-20MG  TABLET] 90 tablet 3    Sig: TAKE 1 TABLET BY MOUTH DAILY     Cardiovascular: CCB + ARB Combos Failed - 04/22/2024 10:47 AM      Failed - K in normal range and within 180 days    Potassium  Date Value Ref Range Status  10/25/2021 3.8 3.5 - 5.1 mmol/L Final         Failed - Cr in normal range and within 180 days    Creatinine, Ser  Date Value Ref Range Status  10/25/2021 0.93 0.61 - 1.24 mg/dL Final         Failed - Na in normal range and within 180 days    Sodium  Date Value Ref Range Status  10/25/2021 136 135 - 145 mmol/L Final         Passed - Patient is not pregnant      Passed - Last BP in normal range    BP Readings from Last 1 Encounters:  02/17/24 118/82         Passed - Valid encounter within last 6 months    Recent Outpatient Visits           2 months ago BMI 31.0-31.9,adult   [facility], Selinda PARAS, MD   4 months ago Urinary frequency   [facility], Selinda PARAS, MD   5 months ago Obesity due to excess calories with serious comorbidity, unspecified class   [facility] at [facility], Selinda PARAS, MD   10 months ago Polyuria   [facility] at [facility], Selinda PARAS, MD

## 2024-05-18 ENCOUNTER — Ambulatory Visit: Admitting: Family Medicine

## 2024-05-24 ENCOUNTER — Ambulatory Visit: Admitting: Family Medicine
